# Patient Record
Sex: Female | Born: 1970 | Race: Black or African American | Hispanic: No | Marital: Married | Smoking: Current every day smoker
Health system: Southern US, Community
[De-identification: ages and names within clinical notes are randomized; demographics above are authoritative.]

## PROBLEM LIST (undated history)

## (undated) DIAGNOSIS — D649 Anemia, unspecified: Secondary | ICD-10-CM

## (undated) DIAGNOSIS — F17200 Nicotine dependence, unspecified, uncomplicated: Secondary | ICD-10-CM

## (undated) DIAGNOSIS — D219 Benign neoplasm of connective and other soft tissue, unspecified: Secondary | ICD-10-CM

## (undated) DIAGNOSIS — F1010 Alcohol abuse, uncomplicated: Secondary | ICD-10-CM

## (undated) HISTORY — DX: Nicotine dependence, unspecified, uncomplicated: F17.200

## (undated) HISTORY — DX: Alcohol abuse, uncomplicated: F10.10

## (undated) HISTORY — DX: Benign neoplasm of connective and other soft tissue, unspecified: D21.9

---

## 1991-05-13 HISTORY — PX: TUBAL LIGATION: SHX77

## 2002-05-12 HISTORY — PX: WISDOM TOOTH EXTRACTION: SHX21

## 2010-05-12 HISTORY — PX: CERVICAL DISC SURGERY: SHX588

## 2012-02-14 ENCOUNTER — Emergency Department (HOSPITAL_COMMUNITY)
Admission: EM | Admit: 2012-02-14 | Discharge: 2012-02-14 | Disposition: A | Discharge status: Home / Self Care | Payer: Self-pay | Attending: Emergency Medicine | Admitting: Emergency Medicine

## 2012-02-14 ENCOUNTER — Encounter (HOSPITAL_COMMUNITY): Payer: Self-pay | Admitting: Emergency Medicine

## 2012-02-14 ENCOUNTER — Emergency Department (HOSPITAL_COMMUNITY): Payer: Self-pay

## 2012-02-14 DIAGNOSIS — F17200 Nicotine dependence, unspecified, uncomplicated: Secondary | ICD-10-CM | POA: Insufficient documentation

## 2012-02-14 DIAGNOSIS — D259 Leiomyoma of uterus, unspecified: Secondary | ICD-10-CM | POA: Insufficient documentation

## 2012-02-14 LAB — CBC WITH DIFFERENTIAL/PLATELET
Basophils Absolute: 0.1 10*3/uL (ref 0.0–0.1)
HCT: 29.7 % — ABNORMAL LOW (ref 36.0–46.0)
Lymphocytes Relative: 26 % (ref 12–46)
Neutro Abs: 3.9 10*3/uL (ref 1.7–7.7)
Neutrophils Relative %: 62 % (ref 43–77)
Platelets: 288 10*3/uL (ref 150–400)
RDW: 19.1 % — ABNORMAL HIGH (ref 11.5–15.5)
WBC: 6.2 10*3/uL (ref 4.0–10.5)

## 2012-02-14 LAB — URINALYSIS, ROUTINE W REFLEX MICROSCOPIC
Glucose, UA: NEGATIVE mg/dL
Leukocytes, UA: NEGATIVE
Nitrite: NEGATIVE
Protein, ur: NEGATIVE mg/dL
Urobilinogen, UA: 0.2 mg/dL (ref 0.0–1.0)

## 2012-02-14 LAB — COMPREHENSIVE METABOLIC PANEL
AST: 90 U/L — ABNORMAL HIGH (ref 0–37)
Albumin: 4 g/dL (ref 3.5–5.2)
CO2: 26 mEq/L (ref 19–32)
Chloride: 98 mEq/L (ref 96–112)
GFR calc non Af Amer: >90 mL/min (ref >90)
Sodium: 132 mEq/L — ABNORMAL LOW (ref 135–145)

## 2012-02-14 MED ORDER — MORPHINE SULFATE 4 MG/ML IJ SOLN
6.0000 mg | Freq: Once | INTRAMUSCULAR | Status: AC
  Administered 2012-02-14: 6 mg via INTRAVENOUS
  Filled 2012-02-14: qty 2

## 2012-02-14 MED ORDER — SODIUM CHLORIDE 0.9 % IV SOLN
1000.0000 mL | Freq: Once | INTRAVENOUS | Status: AC
  Administered 2012-02-14: 1000 mL via INTRAVENOUS

## 2012-02-14 MED ORDER — PROMETHAZINE HCL 25 MG PO TABS: 25.0000 mg | ORAL_TABLET | Freq: Four times a day (QID) | ORAL | Status: DC | PRN

## 2012-02-14 MED ORDER — IOHEXOL 300 MG/ML  SOLN
100.0000 mL | Freq: Once | INTRAMUSCULAR | Status: AC | PRN
  Administered 2012-02-14: 100 mL via INTRAVENOUS

## 2012-02-14 MED ORDER — SODIUM CHLORIDE 0.9 % IV SOLN: 1000.0000 mL | INTRAVENOUS | Status: DC

## 2012-02-14 MED ORDER — HYDROCODONE-ACETAMINOPHEN 5-325 MG PO TABS: 1.0000 | ORAL_TABLET | ORAL | Status: DC | PRN

## 2012-02-14 NOTE — ED Notes (Signed)
Abdominal pain onset one week ago. Denies emesis, denies chills or fever. States periods have been normal until most recent.

## 2012-02-14 NOTE — ED Provider Notes (Signed)
History     CSN: [id number]  Arrival date & time 02/14/12  1114   First MD Initiated Contact with Patient 02/14/12 1148      Chief Complaint  Patient presents with  . Abdominal Pain     The history is provided by the patient.   patient reports a long-standing history of worsening abdominal swelling.  She has a history of uterine fibroids.  She reports her abdominal pain became more severe this week.  She denies nausea and vomiting.  She's had no diarrhea.  She continues to have irregular and heavy menstrual cycles.  She recently moved to [place] from [place].   History reviewed. No pertinent past medical history.  Past Surgical History  Procedure Date  . Back surgery   . Tubal ligation 1993    No family history on file.  History  Substance Use Topics  . Smoking status: Current Every Day Smoker -- 0.5 packs/day    Types: Cigarettes  . Smokeless tobacco: Never Used  . Alcohol Use: 12.6 oz/week    21 Cans of beer per week     daily    OB History    Grav Para Term Preterm Abortions TAB SAB Ect Mult Living                  Review of Systems  All other systems reviewed and are negative.    Allergies  Review of patient's allergies indicates no known allergies.  Home Medications   Current Outpatient Rx  Name Route Sig Dispense Refill  . FERROUS SULFATE 325 (65 FE) MG PO TABS Oral Take 325 mg by mouth 2 (two) times daily with breakfast and lunch.    . ADULT MULTIVITAMIN W/MINERALS CH Oral Take 1 tablet by mouth daily.    Marland Kitchen HYDROCODONE-ACETAMINOPHEN 5-325 MG PO TABS Oral Take 1 tablet by mouth every 4 (four) hours as needed for pain. 15 tablet 0  . PROMETHAZINE HCL 25 MG PO TABS Oral Take 1 tablet (25 mg total) by mouth every 6 (six) hours as needed for nausea. 12 tablet 0    BP 113/58  Pulse 65  Temp 97.9 F (36.6 C) (Oral)  Resp 16  Ht 5\' 3"  (1.6 m)  Wt 120 lb (54.432 kg)  BMI 21.26 kg/m2  SpO2 100%  LMP 02/08/2012  Physical Exam  Nursing note and  vitals reviewed. Constitutional: She is oriented to person, place, and time. She appears well-developed and well-nourished. No distress.  HENT:  Head: Normocephalic and atraumatic.  Eyes: EOM are normal.  Neck: Normal range of motion.  Cardiovascular: Normal rate, regular rhythm and normal heart sounds.   Pulmonary/Chest: Effort normal and breath sounds normal.  Abdominal: Soft. She exhibits no distension.       Large palpable abdominal mass likely representing uterine fibroid.  Nontender.  Bowel sounds are normal  Musculoskeletal: Normal range of motion.  Neurological: She is alert and oriented to person, place, and time.  Skin: Skin is warm and dry.  Psychiatric: She has a normal mood and affect. Judgment normal.    ED Course  Procedures (including critical care time)  Labs Reviewed  URINALYSIS, ROUTINE W REFLEX MICROSCOPIC - Abnormal; Notable for the following:    APPearance CLOUDY (*)     All other components within normal limits  CBC WITH DIFFERENTIAL - Abnormal; Notable for the following:    RBC 3.68 (*)     Hemoglobin 9.4 (*)     HCT 29.7 (*)  MCH 25.5 (*)     RDW 19.1 (*)     All other components within normal limits  COMPREHENSIVE METABOLIC PANEL - Abnormal; Notable for the following:    Sodium 132 (*)  HEMOLYZED SPECIMEN - SUGGEST RECOLLECT   Glucose, Bld 102 (*)  HEMOLYZED SPECIMEN - SUGGEST RECOLLECT   Total Protein 8.4 (*)  HEMOLYZED SPECIMEN - SUGGEST RECOLLECT   AST 90 (*)  HEMOLYZED SPECIMEN - SUGGEST RECOLLECT   All other components within normal limits  POCT PREGNANCY, URINE  LAB REPORT - SCANNED   Ct Abdomen Pelvis W Contrast  02/14/2012  *RADIOLOGY REPORT*  Clinical Data: Abdominal pain.  Uterine fibroid.  CT ABDOMEN AND PELVIS WITH CONTRAST  Technique:  Multidetector CT imaging of the abdomen and pelvis was performed following the standard protocol during bolus administration of intravenous contrast.  Contrast: OMNIPAQUE IOHEXOL 300 MG/ML  SOLN   Comparison: None.  Findings: The liver, spleen, pancreas, and adrenal glands appear unremarkable.  The gallbladder and biliary system appear unremarkable.  Borderline hydronephrosis and mild hydroureter on the right extend down to the iliac vessel crossover.  Numerous large intramural uterine fibroids are present, with heterogeneous internal enhancement and some associated calcification.  Uterine length is 22.3 cm and uterine volume is estimated at 2000 ml, with the uterus extending up above the level of the umbilicus.  A low-density 4.1 x 1.9 cm structure displaced into the abdomen on image 32 of series 2 probably represents the left ovary.  Right ovary is down in the pelvis along the right anterior inferior margin of the uterus.  Urinary bladder unremarkable.  Appendix normal.  IMPRESSION:  1.  Multiple large uterine fibroids causing markedly enlarged uterus extending up above the level of the umbilicus.  Associated volume is over 2000 ml. 2.  Extrinsic mass effect from the uterus is likely causing low- level partial obstruction of the right ureter at the level of the iliac vessel crossover.   Original Report Authenticated By: Dellia Cloud, M.D.     I personally reviewed the imaging tests through PACS system  I reviewed available ER/hospitalization records thought the EMR   1. Uterine fibroid       MDM  This appears to be a large uterine fibroid.  She has normal vital signs.  I've instructed the patient to followup at that gynecology clinic at [facility] hospital for unassigned gynecology care.  She understands the importance of close followup.  There is question of possible early mass effect on the right ureter.  At this time there is no significant hydronephrosis in her BUN and creatinine are normal.  Ever for the patient to urology as she may benefit from stent placement.  Ultimately what she really needs is hysterectomy for removal of the fibroid which will improve her abdominal mass,  abdominal pain in her ureteral compression        Lyanne Co, MD 02/15/12 1534

## 2012-03-15 ENCOUNTER — Ambulatory Visit (INDEPENDENT_AMBULATORY_CARE_PROVIDER_SITE_OTHER): Payer: Self-pay | Admitting: Obstetrics & Gynecology

## 2012-03-15 ENCOUNTER — Encounter: Payer: Self-pay | Admitting: Obstetrics & Gynecology

## 2012-03-15 VITALS — BP 113/69 | HR 80 | Temp 97.0°F | Ht 63.0 in | Wt 126.8 lb

## 2012-03-15 DIAGNOSIS — D259 Leiomyoma of uterus, unspecified: Secondary | ICD-10-CM | POA: Insufficient documentation

## 2012-03-15 MED ORDER — LEUPROLIDE ACETATE (3 MONTH) 11.25 MG IM KIT: 11.2500 mg | PACK | Freq: Once | INTRAMUSCULAR | Status: DC

## 2012-03-15 MED ORDER — MEDROXYPROGESTERONE ACETATE 5 MG PO TABS: 5.0000 mg | ORAL_TABLET | Freq: Every day | ORAL | Status: DC

## 2012-03-15 NOTE — Patient Instructions (Signed)
Uterine Fibroid  A uterine fibroid is a growth (tumor) that occurs in a woman's uterus. This type of tumor is not cancerous and does not spread out of the uterus. A woman can have one or many fibroids, and the fiboid(s) can become quite large. A fibroid can vary in size, weight, and where it grows in the uterus. Most fibroids do not require medical treatment, but some can cause pain or heavy bleeding during and between periods.  CAUSES   A fibroid is the result of a single uterine cell that keeps growing (unregulated), which is different than most cells in the human body. Most cells have a control mechanism that keeps them from reproducing without control.   SYMPTOMS    Bleeding.   Pelvic pain and pressure.   Bladder problems due to the size of the fibroid.   Infertility and miscarriages depending on the size and location of the fibroid.  DIAGNOSIS   A diagnosis is made by physical exam. Your caregiver may feel the lumpy tumors during a pelvic exam. Important information regarding size, location, and number of tumors can be gained by having an ultrasound. It is rare that other tests, such as a CT scan or MRI, are needed.  TREATMENT    Your caregiver may recommend watchful waiting. This involves getting the fibroid checked by your caregiver to see if the fibroids grow or shrink.    Hormonal treatment or an intrauterine device (IUD) may be prescribed.    Surgery may be needed to remove the fibroids (myomectomy) or the uterus (hysterectomy). This depends on your situation.  When fibroids interfere with fertility and a woman wants to become pregnant, a caregiver may recommend having the fibroids removed.   HOME CARE INSTRUCTIONS   Home care depends on how you were treated. In general:    Keep all follow-up appointments with your caregiver.    Only take medicine as told by your caregiver. Do not take aspirin. It can cause bleeding.    If you have excessive periods and soak tampons or pads in a half hour or  less, contact your caregiver immediately. If your periods are troublesome but not so heavy, lie down with your feet raised slightly above your heart. Place cold packs on your lower abdomen.    If your periods are heavy, write down the number of pads or tampons you use per month. Bring this information to your caregiver.    Talk to your caregiver about taking iron pills.    Include green vegetables in your diet.    If you were prescribed a hormonal treatment, take the hormonal medicines as directed.    If you need surgery, ask your caregiver for information on your specific surgery.   SEEK IMMEDIATE MEDICAL CARE IF:   You have pelvic pain or cramps not controlled with medicines.    You have a sudden increase in pelvic pain.    You have an increase of bleeding between and during periods.    You feel lightheaded or have fainting episodes.   MAKE SURE YOU:   Understand these instructions.   Will watch your condition.   Will get help right away if you are not doing well or get worse.  Document Released: 04/25/2000 Document Revised: 07/21/2011 Document Reviewed: 05/19/2011  Exit[employer] Patient Information 2013 [employer].

## 2012-03-15 NOTE — Progress Notes (Signed)
Patient ID: Miranda Jackson, female   DOB: February 08, 1971, 41 y.o.   MRN: [id number]  Chief Complaint  Patient presents with  . Fibroids    "was told from ER that if something does not get done it could affect my kidneys".  Not bleeding today but when start period would last for 8-10 days change several periods    HPI Miranda Jackson is a 41 y.o. female.  Heavy menses, mass in lower abdomen.  ER visit 10/5 abnormal CT scan HPI  Past Medical History  Diagnosis Date  . Fibroids     Past Surgical History  Procedure Date  . Back surgery   . Tubal ligation 1993    Family History  Problem Relation Age of Onset  . Hypertension Maternal Grandfather     Social History History  Substance Use Topics  . Smoking status: Current Every Day Smoker -- 0.5 packs/day    Types: Cigarettes  . Smokeless tobacco: Never Used  . Alcohol Use: 12.6 oz/week    21 Cans of beer per week     Comment: daily    No Known Allergies  Current Outpatient Prescriptions  Medication Sig Dispense Refill  . ferrous sulfate 325 (65 FE) MG tablet Take 325 mg by mouth 2 (two) times daily with breakfast and lunch.      . Multiple Vitamin (MULTIVITAMIN WITH MINERALS) TABS Take 1 tablet by mouth daily.      Marland Kitchen HYDROcodone-acetaminophen (NORCO/VICODIN) 5-325 MG per tablet Take 1 tablet by mouth every 4 (four) hours as needed for pain.  15 tablet  0  . medroxyPROGESTERone (PROVERA) 5 MG tablet Take 1 tablet (5 mg total) by mouth daily.  30 tablet  2  . promethazine (PHENERGAN) 25 MG tablet Take 1 tablet (25 mg total) by mouth every 6 (six) hours as needed for nausea.  12 tablet  0   Current Facility-Administered Medications  Medication Dose Route Frequency Provider Last Rate Last Dose  . leuprolide (LUPRON) injection 11.25 mg  11.25 mg Intramuscular Once Adam Phenix, MD        Review of Systems Review of Systems  Constitutional: Negative.   Gastrointestinal:       Low abdominal mass  Genitourinary: Positive for  menstrual problem and pelvic pain.    Blood pressure 113/69, pulse 80, temperature 97 F (36.1 C), temperature source Oral, height 5\' 3"  (1.6 m), weight 126 lb 12.8 oz (57.516 kg), last menstrual period 03/10/2012.  Physical Exam Physical Exam  Constitutional: She appears well-developed. No distress.  Pulmonary/Chest: Effort normal. No respiratory distress.  Abdominal: Soft. She exhibits mass (18-20 week size pelvic mass).  Genitourinary: Vagina normal. No vaginal discharge found.       Pap done. Uterus irregular 18-20 week  Neurological: She is alert.  Skin: Skin is warm and dry. No pallor.  Psychiatric: She has a normal mood and affect. Her behavior is normal.    Data Reviewed CBC    Component Value Date/Time   WBC 6.2 02/14/2012 1220   RBC 3.68* 02/14/2012 1220   HGB 9.4* 02/14/2012 1220   HCT 29.7* 02/14/2012 1220   PLT 288 02/14/2012 1220   MCV 80.7 02/14/2012 1220   MCH 25.5* 02/14/2012 1220   MCHC 31.6 02/14/2012 1220   RDW 19.1* 02/14/2012 1220   LYMPHSABS 1.6 02/14/2012 1220   MONOABS 0.5 02/14/2012 1220   EOSABS 0.2 02/14/2012 1220   BASOSABS 0.1 02/14/2012 1220    *RADIOLOGY REPORT*  Clinical Data: Abdominal pain. Uterine fibroid.  CT ABDOMEN AND PELVIS WITH CONTRAST  Technique: Multidetector CT imaging of the abdomen and pelvis was  performed following the standard protocol during bolus  administration of intravenous contrast.  Contrast: OMNIPAQUE IOHEXOL 300 MG/ML SOLN  Comparison: None.  Findings: The liver, spleen, pancreas, and adrenal glands appear  unremarkable.  The gallbladder and biliary system appear unremarkable.  Borderline hydronephrosis and mild hydroureter on the right extend  down to the iliac vessel crossover. Numerous large intramural  uterine fibroids are present, with heterogeneous internal  enhancement and some associated calcification. Uterine length is  22.3 cm and uterine volume is estimated at 2000 ml, with the uterus  extending up  above the level of the umbilicus.  A low-density 4.1 x 1.9 cm structure displaced into the abdomen on  image 32 of series 2 probably represents the left ovary. Right  ovary is down in the pelvis along the right anterior inferior  margin of the uterus.  Urinary bladder unremarkable. Appendix normal.  IMPRESSION:  1. Multiple large uterine fibroids causing markedly enlarged  uterus extending up above the level of the umbilicus. Associated  volume is over 2000 ml.  2. Extrinsic mass effect from the uterus is likely causing low-  level partial obstruction of the right ureter at the level of the  iliac vessel crossover.  Original Report Authenticated By: Dellia Cloud, M.D.    Assessment    Fibroid uterus and menorrhagia    Plan    Provera 5 mg Lupron depot 11.25 Endometrial biopsy Candidate for TAH  Miranda Jackson 03/15/2012 5:10 PM        Miranda Jackson 03/15/2012, 5:06 PM

## 2012-03-16 ENCOUNTER — Telehealth: Payer: Self-pay | Admitting: Medical

## 2012-03-16 DIAGNOSIS — D259 Leiomyoma of uterus, unspecified: Secondary | ICD-10-CM

## 2012-03-16 NOTE — Telephone Encounter (Signed)
Attempted to contact patient. Patient left without Rx yesterday and has no pharmacy listed in Epic. If patient calls back please confirm pharmacy for Rx to be called in.

## 2012-03-23 MED ORDER — MEDROXYPROGESTERONE ACETATE 5 MG PO TABS: 5.0000 mg | ORAL_TABLET | Freq: Every day | ORAL | Status: DC

## 2012-03-23 NOTE — Telephone Encounter (Signed)
Called and spoke w/pt. I informed her of Rx needed and obtained the name/location of her pharmacy. Rx sent to [facility] and pt will pick up later today.  Pt voiced understanding.

## 2013-02-14 ENCOUNTER — Ambulatory Visit: Payer: No Typology Code available for payment source | Attending: Internal Medicine

## 2013-03-10 ENCOUNTER — Encounter: Payer: Self-pay | Admitting: Internal Medicine

## 2013-03-10 ENCOUNTER — Ambulatory Visit: Payer: No Typology Code available for payment source | Attending: Internal Medicine | Admitting: Internal Medicine

## 2013-03-10 VITALS — BP 105/70 | HR 90 | Temp 98.4°F | Resp 16 | Ht 63.0 in | Wt 130.0 lb

## 2013-03-10 DIAGNOSIS — D649 Anemia, unspecified: Secondary | ICD-10-CM

## 2013-03-10 DIAGNOSIS — D259 Leiomyoma of uterus, unspecified: Secondary | ICD-10-CM | POA: Insufficient documentation

## 2013-03-10 LAB — ANEMIA PANEL
%SAT: 4 % — ABNORMAL LOW (ref 20–55)
ABS Retic: 41.6 10*3/uL (ref 19.0–186.0)
Ferritin: 4 ng/mL — ABNORMAL LOW (ref 10–291)
Iron: 17 ug/dL — ABNORMAL LOW (ref 42–145)
Retic Ct Pct: 1.2 % (ref 0.4–2.3)
Vitamin B-12: 620 pg/mL (ref 211–911)

## 2013-03-10 LAB — LIPID PANEL
LDL Cholesterol: 101 mg/dL — ABNORMAL HIGH (ref 0–99)
Total CHOL/HDL Ratio: 2.8 Ratio

## 2013-03-10 LAB — TSH: TSH: 0.863 u[IU]/mL (ref 0.350–4.500)

## 2013-03-10 LAB — CBC
HCT: 24.6 % — ABNORMAL LOW (ref 36.0–46.0)
MCH: 21.6 pg — ABNORMAL LOW (ref 26.0–34.0)
MCV: 70.9 fL — ABNORMAL LOW (ref 78.0–100.0)
Platelets: 244 10*3/uL (ref 150–400)
RDW: 19.6 % — ABNORMAL HIGH (ref 11.5–15.5)

## 2013-03-10 MED ORDER — FERROUS SULFATE 325 (65 FE) MG PO TABS: 325.0000 mg | ORAL_TABLET | Freq: Three times a day (TID) | ORAL | Status: DC

## 2013-03-10 NOTE — Progress Notes (Signed)
Pt here to establish care for large uterine fibroids Denies vag bleeding at this time Need physical with pap smear No other medical hx noted

## 2013-03-10 NOTE — Progress Notes (Signed)
Pt instructed to return in 2 weeks per Dr. Jerral Ralph

## 2013-03-10 NOTE — Progress Notes (Signed)
Patient Demographics  Miranda Jackson, is a 42 y.o. female  UJW:[id number]  FAO:[id number]  DOB - 02-19-1971  Chief Complaint  Patient presents with  . Establish Care  . Gynecologic Exam        Subjective:   Miranda Jackson today is here to establish primary care.  Patient is a 42 year old female with a known history of acute uterine fibroids (above the level of the umblicus in 2013 with mass effect on the ureter) who had refused hysterectomy last year, presents to establish medical care. She claims she has significant menorrhagia, sometimes she has periods twice a month and passes clots. She does claim to have some fatigue and exertional dyspnea. She's not on any hormonal therapy, does not take any iron supplementation. She's now anxious to get a hysterectomy and once his referral to the women's clinic.  Currently patient has no complaints. Patient has also has No headache, No chest pain, No abdominal pain,No Nausea, No new weakness tingling or numbness, No Cough or SOB.   Objective:    Filed Vitals:   03/10/13 1515  BP: 105/70  Pulse: 90  Temp: 98.4 F (36.9 C)  TempSrc: Oral  Resp: 16  Height: 5\' 3"  (1.6 m)  Weight: 130 lb (58.968 kg)  SpO2: 100%     ALLERGIES:  No Known Allergies  PAST MEDICAL HISTORY: Past Medical History  Diagnosis Date  . Fibroids     PAST SURGICAL HISTORY: Past Surgical History  Procedure Laterality Date  . Back surgery    . Tubal ligation  1993    FAMILY HISTORY: Family History  Problem Relation Age of Onset  . Hypertension Maternal Grandfather     MEDICATIONS AT HOME: Prior to Admission medications   Medication Sig Start Date End Date Taking? Authorizing Provider  ferrous sulfate 325 (65 FE) MG tablet Take 1 tablet (325 mg total) by mouth 3 (three) times daily with meals. 03/10/13   Shanker Levora Dredge, MD  HYDROcodone-acetaminophen (NORCO/VICODIN) 5-325 MG per tablet Take 1 tablet by mouth every 4 (four) hours as needed for  pain. 02/14/12   Lyanne Co, MD  medroxyPROGESTERone (PROVERA) 5 MG tablet Take 1 tablet (5 mg total) by mouth daily. 03/23/12   Adam Phenix, MD  Multiple Vitamin (MULTIVITAMIN WITH MINERALS) TABS Take 1 tablet by mouth daily.    Historical Provider, MD  promethazine (PHENERGAN) 25 MG tablet Take 1 tablet (25 mg total) by mouth every 6 (six) hours as needed for nausea. 02/14/12   Lyanne Co, MD    SOCIAL HISTORY:   reports that she has been smoking Cigarettes.  She has been smoking about 0.50 packs per day. She has never used smokeless tobacco. She reports that she drinks about 12.6 ounces of alcohol per week. She reports that she does not use illicit drugs.  REVIEW OF SYSTEMS:  Constitutional:   No   Fevers, chills, fatigue.  HEENT:    No headaches, Sore throat,   Cardio-vascular: No chest pain,  Orthopnea, swelling in lower extremities, anasarca, palpitations  GI:  No abdominal pain, nausea, vomiting, diarrhea  Resp: No shortness of breath,  No coughing up of blood.No cough.No wheezing.  Skin:  no rash or lesions.  GU:  no dysuria, change in color of urine, no urgency or frequency.  No flank pain.  Musculoskeletal: No joint pain or swelling.  No decreased range of motion.  No back pain.  Psych: No change in mood or affect. No depression or anxiety.  No  memory loss.   Exam  General appearance :Awake, alert, not in any distress. Speech Clear. Not toxic Looking HEENT: Atraumatic and Normocephalic, pupils equally reactive to light and accomodation Neck: supple, no JVD. No cervical lymphadenopathy.  Chest:Good air entry bilaterally, no added sounds  CVS: S1 S2 regular, no murmurs.  Abdomen: Bowel sounds present, Non tender and not distended with no gaurding, rigidity or rebound., Oval-shaped mass that extends from the pelvis approximately 5 cm above the umblicus. Extremities: B/L Lower Ext shows no edema, both legs are warm to touch Neurology: Awake alert, and  oriented X 3, CN II-XII intact, Non focal Skin:No Rash Wounds:N/A    Data Review   CBC No results found for this basename: WBC, HGB, HCT, PLT, MCV, MCH, MCHC, RDW, NEUTRABS, LYMPHSABS, MONOABS, EOSABS, BASOSABS, BANDABS, BANDSABD,  in the last 168 hours  Chemistries   No results found for this basename: NA, K, CL, CO2, GLUCOSE, BUN, CREATININE, GFRCGP, CALCIUM, MG, AST, ALT, ALKPHOS, BILITOT,  in the last 168 hours ------------------------------------------------------------------------------------------------------------------ No results found for this basename: HGBA1C,  in the last 72 hours ------------------------------------------------------------------------------------------------------------------ No results found for this basename: CHOL, HDL, LDLCALC, TRIG, CHOLHDL, LDLDIRECT,  in the last 72 hours ------------------------------------------------------------------------------------------------------------------ No results found for this basename: TSH, T4TOTAL, FREET3, T3FREE, THYROIDAB,  in the last 72 hours ------------------------------------------------------------------------------------------------------------------ No results found for this basename: VITAMINB12, FOLATE, FERRITIN, TIBC, IRON, RETICCTPCT,  in the last 72 hours  Coagulation profile  No results found for this basename: INR, PROTIME,  in the last 168 hours    Assessment & Plan   Fibroid uterus - Will place a urgent referral to the women's clinic - Pelvic ultrasound  Anemia - Suspect chronic blood loss from menorrhagia - Start iron supplementation  Will order routine labs-CBC, chemistries, A1C, TSH and lipid panel- if she has any significant worsening of her renal function-she may have hydronephrosis from mass effect and may need further imaging.  Follow up in 2 weeks for followup above labs and ultrasound.  The patient was given clear instructions to go to ER or return to medical center if  symptoms don't improve, worsen or new problems develop. The patient verbalized understanding. The patient was told to call to get lab results if they haven't heard anything in the next week.

## 2013-03-11 ENCOUNTER — Encounter (HOSPITAL_COMMUNITY): Payer: Self-pay | Admitting: *Deleted

## 2013-03-11 ENCOUNTER — Inpatient Hospital Stay (HOSPITAL_COMMUNITY)
Admission: AD | Admit: 2013-03-11 | Discharge: 2013-03-11 | Disposition: A | Discharge status: Home / Self Care | Payer: Self-pay | Source: Ambulatory Visit | Attending: Obstetrics & Gynecology | Admitting: Obstetrics & Gynecology

## 2013-03-11 DIAGNOSIS — D500 Iron deficiency anemia secondary to blood loss (chronic): Secondary | ICD-10-CM | POA: Insufficient documentation

## 2013-03-11 DIAGNOSIS — D259 Leiomyoma of uterus, unspecified: Secondary | ICD-10-CM | POA: Insufficient documentation

## 2013-03-11 DIAGNOSIS — N920 Excessive and frequent menstruation with regular cycle: Secondary | ICD-10-CM | POA: Insufficient documentation

## 2013-03-11 HISTORY — DX: Anemia, unspecified: D64.9

## 2013-03-11 LAB — HEMOGLOBIN A1C
Hgb A1c MFr Bld: 4.9 % (ref <5.7)
Mean Plasma Glucose: 94 mg/dL (ref <117)

## 2013-03-11 LAB — CBC
Hemoglobin: 6.4 g/dL — CL (ref 12.0–15.0)
MCH: 21.4 pg — ABNORMAL LOW (ref 26.0–34.0)
MCHC: 29.8 g/dL — ABNORMAL LOW (ref 30.0–36.0)
Platelets: 179 10*3/uL (ref 150–400)
RDW: 20.1 % — ABNORMAL HIGH (ref 11.5–15.5)

## 2013-03-11 MED ORDER — SODIUM CHLORIDE 0.9 % IV SOLN
1020.0000 mg | Freq: Once | INTRAVENOUS | Status: AC
  Administered 2013-03-11: 1020 mg via INTRAVENOUS
  Filled 2013-03-11: qty 34

## 2013-03-11 MED ORDER — SODIUM CHLORIDE 0.9 % IV SOLN
INTRAVENOUS | Status: DC
  Administered 2013-03-11: 18:00:00 via INTRAVENOUS

## 2013-03-11 MED ORDER — PRENATAL PLUS 27-1 MG PO TABS: 1.0000 | ORAL_TABLET | Freq: Every day | ORAL | Status: DC

## 2013-03-11 MED ORDER — PRENATAL PLUS 27-1 MG PO TABS: 1.0000 | ORAL_TABLET | Freq: Two times a day (BID) | ORAL | Status: DC

## 2013-03-11 MED ORDER — MEGESTROL ACETATE 40 MG PO TABS: 40.0000 mg | ORAL_TABLET | Freq: Every day | ORAL | Status: DC

## 2013-03-11 NOTE — MAU Provider Note (Signed)
CC: Abdominal Pain    First Provider Initiated Contact with Patient 03/11/13 1656      HPI Miranda Jackson is a 42 y.o. [id number] who presents after being seen yesterday by PCP, Dr. Jerral Ralph, and was called and told to come here because her blood count was low (hgb 7.5). She has multiple large fibroids and menometrorrhagia. She reports having heavy menses with passage of clots often 2 times a month. LMP 03/04/13. She states she was bleeding heavily through yesterday, but it has tapered today. She has chronic abdominal pain. No orthostatic symptoms or increased fatigue. She does have a headache and states she has been drinking beer all day at a Halloween party. She has an appointment Monday for pelvic US.  She was evaluated about a year ago by Dr. Debroah Loop at [facility]. At that time she had MRI showing multiple large fibroids with mild obstruction of right ureter. She was given Provera which she states did not help with her bleeding. Plan was to have an endometrial biopsy and Lupron but she did neither of those. She was offered a hysterectomy but declined at that time; now she does want TAH. Denies illicit drug use or other medical problems.    Past Medical History  Diagnosis Date  . Fibroids   . Anemia     OB History  Gravida Para Term Preterm AB SAB TAB Ectopic Multiple Living  3 3 3       3     # Outcome Date GA Lbr Len/2nd Weight Sex Delivery Anes PTL Lv  3 TRM      SVD   Y  2 TRM      SVD   Y  1 TRM      SVD   Y      Past Surgical History  Procedure Laterality Date  . Back surgery    . Tubal ligation  1993    History   Social History  . Marital Status: Married    Spouse Name: N/A    Number of Children: N/A  . Years of Education: N/A   Occupational History  . Not on file.   Social History Main Topics  . Smoking status: Current Every Day Smoker -- 0.50 packs/day    Types: Cigarettes  . Smokeless tobacco: Never Used  . Alcohol Use: 12.6 oz/week    21 Cans of beer per  week     Comment: daily  . Drug Use: No  . Sexual Activity: Yes    Birth Control/ Protection: Surgical   Other Topics Concern  . Not on file   Social History Narrative  . No narrative on file    No current facility-administered medications on file prior to encounter.   Current Outpatient Prescriptions on File Prior to Encounter  Medication Sig Dispense Refill  . Multiple Vitamin (MULTIVITAMIN WITH MINERALS) TABS Take 1 tablet by mouth daily.        No Known Allergies  ROS Pertinent items in HPI  PHYSICAL EXAM Filed Vitals:   03/11/13 1626  BP: 110/57  Pulse: 80  Temp: 98.3 F (36.8 C)  Resp: 18   General: Well nourished, well developed female in no acute distress, apparently mildly inebriated Cardiovascular: RRR w/o murmur Respiratory: Normal effort Abdomen: Soft, mildly tender, uterus enlarged 26 wk size, firm, irregular. nontender Back: No CVAT Extremities: No edema Neurologic: Alert and oriented Peri-pad: small amount dark blood  LAB RESULTS Results for orders placed during the hospital encounter of 03/11/13 (from the  past 24 hour(s))  CBC     Status: Abnormal   Collection Time    03/11/13  5:20 PM      Result Value Range   WBC 5.7  4.0 - 10.5 K/uL   RBC 2.99 (*) 3.87 - 5.11 MIL/uL   Hemoglobin 6.4 (*) 12.0 - 15.0 g/dL   HCT 45.4 (*) 09.8 - 11.9 %   MCV 71.9 (*) 78.0 - 100.0 fL   MCH 21.4 (*) 26.0 - 34.0 pg   MCHC 29.8 (*) 30.0 - 36.0 g/dL   RDW 14.7 (*) 82.9 - 56.2 %   Platelets 179  150 - 400 K/uL    IMAGING No results found.  MAU COURSE C/W Dr. Erin Fulling. IV Ferraheme and D/C home on Megace and Integra   ASSESSMENT  1. 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 See AVS for patient education.    Medication List    STOP taking these medications       multivitamin with minerals Tabs tablet      TAKE these medications       megestrol 40 MG tablet  Commonly known as:   MEGACE  Take 1 tablet (40 mg total) by mouth daily.     OVER THE COUNTER MEDICATION  Take 1 tablet by mouth daily. Patient takes B 12     prenatal vitamin w/FE, FA 27-1 MG Tabs tablet  Take 1 tablet by mouth 2 (two) times daily.       Follow-up Information   Follow up with [facility]. (someone from clinic will call you with an appointment within the next 2 weeks)    Specialty:  Obstetrics and Gynecology   Contact information:   [address] [place] [address] [phone]       Danae Orleans, CNM 03/11/2013 5:27 PM

## 2013-03-11 NOTE — Progress Notes (Signed)
CRITICAL VALUE ALERT  Critical value received:  Hgb 6.4 Hct 21.5  Date of notification:  03/11/13  Time of notification:  1733  Critical value read back:yes  Nurse who received alert:  Dorrene German RN  MD notified (1st page): D. Poe CNM  Time of first page: Verbally notified @ 1735  MD notified (2nd page) N/A  Time of second page:N/A  Responding MD: D. Poe CNM on unit  Time MD responded:  1735

## 2013-03-11 NOTE — MAU Note (Signed)
Came on menstrual twice in October.  Has a fibroid, wants it removed. Was at a halloween party, was received a call to go to the e.r.- her levels were too low.

## 2013-03-12 NOTE — MAU Provider Note (Signed)
Pt with anemia due to chronic blood loss most probably due to large uterine fibroids.  Pt was prev evaluated but, declined surgery. She is asymptomatic today.  Given IV iron and need f/u at GYN ofc.  Already has appt scheduled for sono.  Attestation of Attending Supervision of Advanced Practitioner (CNM/NP): Evaluation and management procedures were performed by the Advanced Practitioner under my supervision and collaboration.  I have reviewed the Advanced Practitioner's note and chart, and I agree with the management and plan.  HARRAWAY-SMITH, Donalyn Schneeberger 6:44 AM

## 2013-03-14 ENCOUNTER — Ambulatory Visit (HOSPITAL_COMMUNITY)
Admission: RE | Admit: 2013-03-14 | Discharge: 2013-03-14 | Disposition: A | Discharge status: Home / Self Care | Payer: No Typology Code available for payment source | Source: Ambulatory Visit | Attending: Internal Medicine | Admitting: Internal Medicine

## 2013-03-14 DIAGNOSIS — N852 Hypertrophy of uterus: Secondary | ICD-10-CM | POA: Insufficient documentation

## 2013-03-14 DIAGNOSIS — D259 Leiomyoma of uterus, unspecified: Secondary | ICD-10-CM

## 2013-03-14 DIAGNOSIS — D649 Anemia, unspecified: Secondary | ICD-10-CM

## 2013-03-14 DIAGNOSIS — R9389 Abnormal findings on diagnostic imaging of other specified body structures: Secondary | ICD-10-CM | POA: Insufficient documentation

## 2013-03-16 ENCOUNTER — Telehealth: Payer: Self-pay | Admitting: Emergency Medicine

## 2013-03-16 NOTE — Telephone Encounter (Signed)
Pt called with c/o clotting without pain.states she feels good but concerned about clotting. Pt unable to afford Megestrol 40 mg and Prenatal vitamin. Informed pt to go directly to [facility] if pain is followed with clots and dizziness

## 2013-03-18 ENCOUNTER — Telehealth: Payer: Self-pay

## 2013-03-18 NOTE — Telephone Encounter (Signed)
Spoke with kim at [employer] She stated that the cmp lab test was never ordered No results available for this test

## 2013-03-23 ENCOUNTER — Ambulatory Visit: Payer: No Typology Code available for payment source | Attending: Internal Medicine | Admitting: Internal Medicine

## 2013-03-23 VITALS — BP 119/75 | HR 77 | Temp 98.5°F | Resp 17 | Wt 128.0 lb

## 2013-03-23 DIAGNOSIS — D649 Anemia, unspecified: Secondary | ICD-10-CM

## 2013-03-23 DIAGNOSIS — D259 Leiomyoma of uterus, unspecified: Secondary | ICD-10-CM | POA: Insufficient documentation

## 2013-03-23 LAB — BASIC METABOLIC PANEL
Calcium: 9.1 mg/dL (ref 8.4–10.5)
Chloride: 105 mEq/L (ref 96–112)
Glucose, Bld: 78 mg/dL (ref 70–99)
Potassium: 3.9 mEq/L (ref 3.5–5.3)
Sodium: 137 mEq/L (ref 135–145)

## 2013-03-23 LAB — CBC
HCT: 26.8 % — ABNORMAL LOW (ref 36.0–46.0)
Hemoglobin: 8.1 g/dL — ABNORMAL LOW (ref 12.0–15.0)
Platelets: 195 10*3/uL (ref 150–400)
RBC: 3.26 MIL/uL — ABNORMAL LOW (ref 3.87–5.11)
RDW: 28.9 % — ABNORMAL HIGH (ref 11.5–15.5)
WBC: 5.1 10*3/uL (ref 4.0–10.5)

## 2013-03-23 NOTE — Progress Notes (Signed)
Patient ID: Miranda Jackson, female   DOB: 01-28-1971, 42 y.o.   MRN: [id number]  Patient Demographics  Miranda Jackson, is a 42 y.o. female  [id number]  [id number]  DOB - April 26, 1971  Chief Complaint  Patient presents with  . Follow-up  . Fibroids        Subjective:   Miranda Jackson with History of large uterine fibroid, severe menorrhagia  is here for followup, she is a pending visit with OB physician in 2 days for her uterine fibroid, she currently is having no vaginal spotting or bleeding. Her symptoms of fatigue and tiredness are much better. No other subjective complaints. She is agreeable to hysterectomy if needed.  Denies any subjective complaints except as above, no active headache, no chest abdominal pain at this time, not short of breath. No focal weakness which is new.   Objective:    Patient Active Problem List   Diagnosis Date Noted  . Anemia 03/23/2013  . Leiomyoma of uterus, unspecified 03/15/2012     Filed Vitals:   03/23/13 1422  BP: 119/75  Pulse: 77  Temp: 98.5 F (36.9 C)  Resp: 17  Weight: 128 lb (58.06 kg)  SpO2: 100%     Exam   Awake Alert, Oriented X 3, No new F.N deficits, Normal affect [place].AT,PERRAL Supple Neck,No JVD, No cervical lymphadenopathy appriciated.  Symmetrical Chest wall movement, Good air movement bilaterally, CTAB RRR,No Gallops,Rubs or new Murmurs, No Parasternal Heave +ve B.Sounds, Abd Soft, Non tender, large firm uterine mass palpable around the umbilicus, No rebound - guarding or rigidity. No Cyanosis, Clubbing or edema, No new Rash or bruise    Data Review   Lab Results  Component Value Date   WBC 5.7 03/11/2013   HGB 6.4* 03/11/2013   HCT 21.5* 03/11/2013   MCV 71.9* 03/11/2013   PLT 179 03/11/2013      Chemistry      Component Value Date/Time   NA 132* 02/14/2012 1220   K HEMOLYZED SPECIMEN - SUGGEST RECOLLECT 02/14/2012 1220   CL 98 02/14/2012 1220   CO2 26 02/14/2012 1220   BUN 8 02/14/2012 1220   CREATININE 0.57 02/14/2012 1220      Component Value Date/Time   CALCIUM 9.2 02/14/2012 1220   ALKPHOS HEMOLYZED SPECIMEN - SUGGEST RECOLLECT 02/14/2012 1220   AST 90* 02/14/2012 1220   ALT HEMOLYZED SPECIMEN - SUGGEST RECOLLECT 02/14/2012 1220   BILITOT HEMOLYZED SPECIMEN - SUGGEST RECOLLECT 02/14/2012 1220       Lab Results  Component Value Date   HGBA1C 4.9 03/10/2013    Lab Results  Component Value Date   CHOL 185 03/10/2013   HDL 67 03/10/2013   LDLCALC 101* 03/10/2013   TRIG 84 03/10/2013   CHOLHDL 2.8 03/10/2013    Lab Results  Component Value Date   TSH 0.863 03/10/2013    No results found for this basename: PSA      Prior to Admission medications   Medication Sig Start Date End Date Taking? Authorizing Provider  megestrol (MEGACE) 40 MG tablet Take 1 tablet (40 mg total) by mouth daily. 03/11/13   Deirdre C Poe, CNM  OVER THE COUNTER MEDICATION Take 1 tablet by mouth daily. Patient takes B 12    Historical Provider, MD  prenatal vitamin w/FE, FA (PRENATAL 1 + 1) 27-1 MG TABS tablet Take 1 tablet by mouth 2 (two) times daily. 03/11/13   Deirdre Colin Mulders, CNM     Assessment & Plan    Massive uterine fibroids,  CT reviewed from last year which was showing some kinking of the right ureter, she was offered hysterectomy last year which she had refused. She is now agreeable for hysterectomy. She is a pending appointment with OB physician in 2 days. We'll request OB to consider hysterectomy on urgent basis. I will repeat BMP to look at her renal function, I hope she has not developed any renal insufficiency.   Anemia likely from severe menorrhagia that she experiences from a massive uterine fibroids, she started to take and supplements few weeks ago, will repeat CBC, if she still significantly anemic she might require transfusion and hospital visit for a short period.     Routine health maintenance.  She refused flu shot   Leroy Sea M.D on 03/23/2013 at 2:28  PM

## 2013-03-23 NOTE — Progress Notes (Signed)
Patient here for follow up on her fibroid

## 2013-03-24 LAB — ANEMIA PANEL
ABS Retic: 231.5 10*3/uL — ABNORMAL HIGH (ref 19.0–186.0)
Ferritin: 268 ng/mL (ref 10–291)
Iron: 99 ug/dL (ref 42–145)
RBC.: 3.26 MIL/uL — ABNORMAL LOW (ref 3.87–5.11)
TIBC: 348 ug/dL (ref 250–470)
UIBC: 249 ug/dL (ref 125–400)
Vitamin B-12: 505 pg/mL (ref 211–911)

## 2013-03-25 ENCOUNTER — Encounter: Payer: No Typology Code available for payment source | Admitting: Obstetrics & Gynecology

## 2013-04-11 ENCOUNTER — Encounter: Payer: No Typology Code available for payment source | Admitting: Obstetrics & Gynecology

## 2013-05-09 ENCOUNTER — Ambulatory Visit (INDEPENDENT_AMBULATORY_CARE_PROVIDER_SITE_OTHER): Payer: No Typology Code available for payment source | Admitting: Obstetrics & Gynecology

## 2013-05-09 ENCOUNTER — Encounter: Payer: Self-pay | Admitting: Obstetrics & Gynecology

## 2013-05-09 VITALS — BP 101/62 | HR 62 | Temp 97.6°F | Ht 63.0 in | Wt 130.9 lb

## 2013-05-09 DIAGNOSIS — D259 Leiomyoma of uterus, unspecified: Secondary | ICD-10-CM

## 2013-05-09 DIAGNOSIS — D219 Benign neoplasm of connective and other soft tissue, unspecified: Secondary | ICD-10-CM

## 2013-05-09 DIAGNOSIS — D500 Iron deficiency anemia secondary to blood loss (chronic): Secondary | ICD-10-CM

## 2013-05-09 DIAGNOSIS — Z01419 Encounter for gynecological examination (general) (routine) without abnormal findings: Secondary | ICD-10-CM

## 2013-05-09 MED ORDER — MEDROXYPROGESTERONE ACETATE 10 MG PO TABS: 20.0000 mg | ORAL_TABLET | Freq: Every day | ORAL | Status: DC

## 2013-05-09 NOTE — Progress Notes (Signed)
Subjective:     Patient ID: Miranda Jackson, female   DOB: [birth year], 42 y.o.   MRN: [id number]  HPI Pt presents with c/o large symptomatic uterine fibroids.  Surgery was recommended io the past.  She reports being referred again by her primary care provider for anemia.  She c/o bleeding between cycles.  Past Medical History  Diagnosis Date  . Fibroids   . Anemia    Past Surgical History  Procedure Laterality Date  . Back surgery    . Tubal ligation  1993   Current Outpatient Prescriptions on File Prior to Visit  Medication Sig Dispense Refill  . OVER THE COUNTER MEDICATION Take 1 tablet by mouth daily. Patient takes B 12      . prenatal vitamin w/FE, FA (PRENATAL 1 + 1) 27-1 MG TABS tablet Take 1 tablet by mouth 2 (two) times daily.  30 each  0  . megestrol (MEGACE) 40 MG tablet Take 1 tablet (40 mg total) by mouth daily.  30 tablet  0   No current facility-administered medications on file prior to visit.  Pt NOT taking the megace due to cost  History   Social History  . Marital Status: Married    Spouse Name: N/A    Number of Children: N/A  . Years of Education: N/A   Occupational History  . Not on file.   Social History Main Topics  . Smoking status: Current Every Day Smoker -- 0.50 packs/day    Types: Cigarettes  . Smokeless tobacco: Never Used  . Alcohol Use: 12.6 oz/week    21 Cans of beer per week     Comment: daily  . Drug Use: No  . Sexual Activity: Yes    Birth Control/ Protection: Surgical   Other Topics Concern  . Not on file   Social History Narrative  . No narrative on file   Family History  Problem Relation Age of Onset  . Hypertension Maternal Grandfather   . Heart disease Mother     heart attack     Review of Systems     Objective:   Physical Exam BP 101/62  Pulse 62  Temp(Src) 97.6 F (36.4 C)  Ht 5\' 3"  (1.6 m)  Wt 130 lb 14.4 oz (59.376 kg)  BMI 23.19 kg/m2  LMP 05/07/2013 Pt in NAD Abd: fundal height >6cm above the umbilicus;  nontender; appears mobile. GU: EGBUS: no lesions Vagina: large amount of blood in vault Cervix: no lesion; no mucopurulent d/c Uterus: >26cm; mobile; nontender Adnexa: non tender; unable to adequately assess due to uterine mass   CBC    Component Value Date/Time   WBC 5.1 03/23/2013 1451   RBC 3.26* 03/23/2013 1451   RBC 3.26* 03/23/2013 1451   HGB 8.1* 03/23/2013 1451   HCT 26.8* 03/23/2013 1451   PLT 195 03/23/2013 1451   MCV 82.2 03/23/2013 1451   MCH 24.8* 03/23/2013 1451   MCHC 30.2 03/23/2013 1451   RDW 28.9* 03/23/2013 1451   LYMPHSABS 1.6 02/14/2012 1220   MONOABS 0.5 02/14/2012 1220   EOSABS 0.2 02/14/2012 1220   BASOSABS 0.1 02/14/2012 1220       02/21/2012 Clinical Data: Abdominal pain. Uterine fibroid.  CT ABDOMEN AND PELVIS WITH CONTRAST  Technique: Multidetector CT imaging of the abdomen and pelvis was  performed following the standard protocol during bolus  administration of intravenous contrast.  Contrast: OMNIPAQUE IOHEXOL 300 MG/ML SOLN  Comparison: None.  Findings: The liver, spleen, pancreas, and adrenal glands  appear  unremarkable.  The gallbladder and biliary system appear unremarkable.  Borderline hydronephrosis and mild hydroureter on the right extend  down to the iliac vessel crossover. Numerous large intramural  uterine fibroids are present, with heterogeneous internal  enhancement and some associated calcification. Uterine length is  22.3 cm and uterine volume is estimated at 2000 ml, with the uterus  extending up above the level of the umbilicus.  A low-density 4.1 x 1.9 cm structure displaced into the abdomen on  image 32 of series 2 probably represents the left ovary. Right  ovary is down in the pelvis along the right anterior inferior  margin of the uterus.  Urinary bladder unremarkable. Appendix normal.  IMPRESSION:  1. Multiple large uterine fibroids causing markedly enlarged  uterus extending up above the level of the  umbilicus. Associated  volume is over 2000 ml.  2. Extrinsic mass effect from the uterus is likely causing low-  level partial obstruction of the right ureter at the level of the  iliac vessel crossover  03/14/2013 EXAM:  TRANSABDOMINAL ULTRASOUND OF PELVIS  TECHNIQUE:  Transabdominal ultrasound examination of the pelvis was performed  including evaluation of the uterus, ovaries, adnexal regions, and  pelvic cul-de-sac.  COMPARISON: CT of the abdomen and pelvis on 02/14/2012  FINDINGS:  Uterus  Measurements: Likely greater than 24.6 x 11.9 x 16.7 cm. Multiple  uterine fibroids are identified. The largest discrete measured  fibroids are as follows:  13.6 x 10.9 x 12.8 cm in the posterior uterus  7.3 x 7.3 x 7.6 cm in the fundal region  6.0 x 5.7 x 5.9 cm in the anterior uterus  Endometrium  Thickness: 17.4 mm. There is fluid and echogenic debris within the  endometrial canal. A small hyperechoic mass within the canal  measures 1.4 x 0.6 x 0.7 cm.  Right ovary  The ovary is not visualized, likely because of intervening bowel  loops.  Left ovary  The ovary is not visualized, likely because of intervening bowel  loops.  Other findings: No free fluid  IMPRESSION:  1. Enlarged fibroid/enlarged uterus containing multiple fibroids.  2. Endometrial canal displaced and tortuous, secondary to the  fibroids.  3. Thickened endometrial stripe and focal endometrial lesion.  Consider further evaluation with sonohysterogram for confirmation  prior to hysteroscopy. Endometrial sampling should also be  considered if patient is at high risk for endometrial carcinoma.  (Ref: Radiological Reasoning: Algorithmic Workup of Abnormal Vaginal  Bleeding with Endovaginal Sonography and Sonohysterography. AJR  2008; 161:W96-04)       Assessment:     Symptomatic uterine fibroids- unable to take Megace due to cost  Pt wants definitve treatement      Plan:     F/u PAP F/u for Endo bx (MUST have  prior to surg) Pt want definitive treatment with hyst with bilateral salpingectomy.  Will review presurg info at next visit.  Will schedule now.

## 2013-05-09 NOTE — Patient Instructions (Addendum)
Uterine Fibroid A uterine fibroid is a growth (tumor) that occurs in a woman's uterus. This type of tumor is not cancerous and does not spread out of the uterus. A woman can have one or many fibroids, and the fiboid(s) can become quite large. A fibroid can vary in size, weight, and where it grows in the uterus. Most fibroids do not require medical treatment, but some can cause pain or heavy bleeding during and between periods. CAUSES  A fibroid is the result of a single uterine cell that keeps growing (unregulated), which is different than most cells in the human body. Most cells have a control mechanism that keeps them from reproducing without control.  SYMPTOMS   Bleeding.  Pelvic pain and pressure.  Bladder problems due to the size of the fibroid.  Infertility and miscarriages depending on the size and location of the fibroid. DIAGNOSIS  A diagnosis is made by physical exam. Your caregiver may feel the lumpy tumors during a pelvic exam. Important information regarding size, location, and number of tumors can be gained by having an ultrasound. It is rare that other tests, such as a CT scan or MRI, are needed. TREATMENT   Your caregiver may recommend watchful waiting. This involves getting the fibroid checked by your caregiver to see if the fibroids grow or shrink.   Hormonal treatment or an intrauterine device (IUD) may be prescribed.   Surgery may be needed to remove the fibroids (myomectomy) or the uterus (hysterectomy). This depends on your situation. When fibroids interfere with fertility and a woman wants to become pregnant, a caregiver may recommend having the fibroids removed.  HOME CARE INSTRUCTIONS  Home care depends on how you were treated. In general:   Keep all follow-up appointments with your caregiver.   Only take medicine as told by your caregiver. Do not take aspirin. It can cause bleeding.   If you have excessive periods and soak tampons or pads in a half hour or  less, contact your caregiver immediately. If your periods are troublesome but not so heavy, lie down with your feet raised slightly above your heart. Place cold packs on your lower abdomen.   If your periods are heavy, write down the number of pads or tampons you use per month. Bring this information to your caregiver.   Talk to your caregiver about taking iron pills.   Include green vegetables in your diet.   If you were prescribed a hormonal treatment, take the hormonal medicines as directed.   If you need surgery, ask your caregiver for information on your specific surgery.  SEEK IMMEDIATE MEDICAL CARE IF:  You have pelvic pain or cramps not controlled with medicines.   You have a sudden increase in pelvic pain.   You have an increase of bleeding between and during periods.   You feel lightheaded or have fainting episodes.  MAKE SURE YOU:  Understand these instructions.  Will watch your condition.  Will get help right away if you are not doing well or get worse. Document Released: 04/25/2000 Document Revised: 07/21/2011 Document Reviewed: 11/25/2012 [facility] Patient Information 2014 [place], [place]. Hysterectomy Information  A hysterectomy is a procedure where your uterus is surgically removed. It will no longer be possible to have menstrual periods or to become pregnant. The tubes and ovaries can be removed (bilateral salpingo-oopherectomy) during this surgery as well.  REASONS FOR A HYSTERECTOMY  Persistent, abnormal bleeding.  Lasting (chronic) pelvic pain or infection.  The lining of the uterus (endometrium) starts  growing outside the uterus (endometriosis).  The endometrium starts growing in the muscle of the uterus (adenomyosis).  The uterus falls down into the vagina (pelvic organ prolapse).  Symptomatic uterine fibroids.  Precancerous cells.  Cervical cancer or uterine cancer. TYPES OF HYSTERECTOMIES  Supracervical hysterectomy. This type  removes the top part of the uterus, but not the cervix.  Total hysterectomy. This type removes the uterus and cervix.  Radical hysterectomy. This type removes the uterus, cervix, and the fibrous tissue that holds the uterus in place in the pelvis (parametrium). WAYS A HYSTERECTOMY CAN BE PERFORMED  Abdominal hysterectomy. A large surgical cut (incision) is made in the abdomen. The uterus is removed through this incision.  Vaginal hysterectomy. An incision is made in the vagina. The uterus is removed through this incision. There are no abdominal incisions.  Conventional laparoscopic hysterectomy. A thin, lighted tube with a camera (laparoscope) is inserted into 3 or 4 small incisions in the abdomen. The uterus is cut into small pieces. The small pieces are removed through the incisions, or they are removed through the vagina.  Laparoscopic assisted vaginal hysterectomy (LAVH). Three or four small incisions are made in the abdomen. Part of the surgery is performed laparoscopically and part vaginally. The uterus is removed through the vagina.  Robot-assisted laparoscopic hysterectomy. A laparoscope is inserted into 3 or 4 small incisions in the abdomen. A computer-controlled device is used to give the surgeon a 3D image. This allows for more precise movements of surgical instruments. The uterus is cut into small pieces and removed through the incisions or removed through the vagina. RISKS OF HYSTERECTOMY   Bleeding and risk of blood transfusion. Tell your caregiver if you do not want to receive any blood products.  Blood clots in the legs or lung.  Infection.  Injury to surrounding organs.  Anesthesia problems or side effects.  Conversion to an abdominal hysterectomy. WHAT TO EXPECT AFTER A HYSTERECTOMY  You will be given pain medicine.  You will need to have someone with you for the first 3 to 5 days after you go home.  You will need to follow up with your surgeon in 2 to 4 weeks  after surgery to evaluate your progress.  You may have early menopause symptoms like hot flashes, night sweats, and insomnia.  If you had a hysterectomy for a problem that was not a cancer or a condition that could lead to cancer, then you no longer need Pap tests. However, even if you no longer need a Pap test, a regular exam is a good idea to make sure no other problems are starting. Document Released: 10/22/2000 Document Revised: 07/21/2011 Document Reviewed: 12/07/2010 [facility] Patient Information 2014 [place], [place].

## 2013-05-16 ENCOUNTER — Telehealth: Payer: Self-pay

## 2013-05-16 NOTE — Telephone Encounter (Signed)
Called pt. 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 Informed pt. This meant she just needs to have another pap smear in one year; advised her to call in November to schedule an appointment for pap in December 2015. Pt. Verbalized understanding and had no other questions or concerns.

## 2013-05-16 NOTE — Telephone Encounter (Signed)
Message copied by Geanie Logan on Mon May 16, 2013  4:40 PM ------      Message from: Lavonia Drafts      Created: Fri May 13, 2013  9:31 AM       Please call pt.  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 She needs a PAP in 1 year.            Thx,      clh-S ------

## 2013-05-23 ENCOUNTER — Encounter (HOSPITAL_COMMUNITY): Payer: Self-pay | Admitting: Pharmacist

## 2013-05-25 ENCOUNTER — Other Ambulatory Visit: Payer: No Typology Code available for payment source | Admitting: Obstetrics & Gynecology

## 2013-06-02 ENCOUNTER — Encounter (HOSPITAL_COMMUNITY): Payer: Self-pay

## 2013-06-02 ENCOUNTER — Encounter (HOSPITAL_COMMUNITY)
Admission: RE | Admit: 2013-06-02 | Discharge: 2013-06-02 | Disposition: A | Discharge status: Home / Self Care | Payer: No Typology Code available for payment source | Source: Ambulatory Visit | Attending: Obstetrics & Gynecology | Admitting: Obstetrics & Gynecology

## 2013-06-02 ENCOUNTER — Telehealth: Payer: Self-pay | Admitting: General Practice

## 2013-06-02 ENCOUNTER — Ambulatory Visit: Payer: No Typology Code available for payment source | Admitting: Obstetrics & Gynecology

## 2013-06-02 DIAGNOSIS — Z01818 Encounter for other preprocedural examination: Secondary | ICD-10-CM | POA: Insufficient documentation

## 2013-06-02 DIAGNOSIS — Z01812 Encounter for preprocedural laboratory examination: Secondary | ICD-10-CM | POA: Insufficient documentation

## 2013-06-02 LAB — CBC
HCT: 29.1 % — ABNORMAL LOW (ref 36.0–46.0)
Hemoglobin: 9.4 g/dL — ABNORMAL LOW (ref 12.0–15.0)
MCH: 29.1 pg (ref 26.0–34.0)
MCHC: 32.3 g/dL (ref 30.0–36.0)
MCV: 90.1 fL (ref 78.0–100.0)
Platelets: 166 10*3/uL (ref 150–400)
RBC: 3.23 MIL/uL — AB (ref 3.87–5.11)
RDW: 17.6 % — ABNORMAL HIGH (ref 11.5–15.5)
WBC: 5.4 10*3/uL (ref 4.0–10.5)

## 2013-06-02 NOTE — Telephone Encounter (Signed)
Called patient to inquire if she was coming to her 1:45 appt today with Dr Ihor Dow and patient stated she had no idea about this appt but was here this morning for her pre-op appt and knew nothing about this appt. Asked patient if she could come today because this biopsy has to be done, patient stated she would have to find a ride. Asked patient if she could be here tomorrow morning at 8am for the biopsy with Dr Ihor Dow, patient stated that she would be here. Told patient that if she misses this appt, her surgery will be canceled and will not be done. Patient verbalized understanding and stated she would definitely be here tomorrow. Patient had no further questions

## 2013-06-02 NOTE — Patient Instructions (Addendum)
   Your procedure is scheduled on:  Monday, Jan 26  Enter through the Main Entrance of [facility] at: [place] up the phone at the desk and dial [phone] and inform us of your arrival.  Please call this number if you have any problems the morning of surgery: [phone]  Remember: Do not eat or drink after midnight:  Sunday Take these medicines the morning of surgery with a SIP OF WATER:  None  Do not wear jewelry, make-up, or FINGER nail polish No metal in your hair or on your body. Do not wear lotions, powders, perfumes.  You may wear deodorant.   Do not bring valuables to the hospital. Contacts, dentures or bridgework may not be worn into surgery.  Leave suitcase in the car. After Surgery it may be brought to your room. For patients being admitted to the hospital, checkout time is 11:00am the day of discharge.  Home with husband Phillips Odor  cell [phone].

## 2013-06-03 ENCOUNTER — Ambulatory Visit (INDEPENDENT_AMBULATORY_CARE_PROVIDER_SITE_OTHER): Payer: No Typology Code available for payment source | Admitting: Obstetrics & Gynecology

## 2013-06-03 ENCOUNTER — Other Ambulatory Visit (HOSPITAL_COMMUNITY)
Admission: RE | Admit: 2013-06-03 | Discharge: 2013-06-03 | Disposition: A | Discharge status: Home / Self Care | Payer: No Typology Code available for payment source | Source: Ambulatory Visit | Attending: Obstetrics & Gynecology | Admitting: Obstetrics & Gynecology

## 2013-06-03 ENCOUNTER — Encounter: Payer: Self-pay | Admitting: Obstetrics & Gynecology

## 2013-06-03 VITALS — BP 115/79 | HR 76 | Temp 98.4°F | Ht 63.0 in | Wt 126.9 lb

## 2013-06-03 DIAGNOSIS — N920 Excessive and frequent menstruation with regular cycle: Secondary | ICD-10-CM | POA: Insufficient documentation

## 2013-06-03 DIAGNOSIS — D259 Leiomyoma of uterus, unspecified: Secondary | ICD-10-CM | POA: Insufficient documentation

## 2013-06-03 DIAGNOSIS — N921 Excessive and frequent menstruation with irregular cycle: Secondary | ICD-10-CM

## 2013-06-03 DIAGNOSIS — Z01812 Encounter for preprocedural laboratory examination: Secondary | ICD-10-CM

## 2013-06-03 DIAGNOSIS — D219 Benign neoplasm of connective and other soft tissue, unspecified: Secondary | ICD-10-CM

## 2013-06-03 LAB — POCT PREGNANCY, URINE: PREG TEST UR: NEGATIVE

## 2013-06-03 NOTE — Progress Notes (Addendum)
Subjective:     Patient ID: Miranda Jackson, female   DOB: [birth year], 43 y.o.   MRN: [id number]  HPI Pt presents today for preop Endobx.  H/o menorrhagia and large uterine fibroids.  She reports that she began her menses again yesterday and is bleeding heavily.    Past Medical History  Diagnosis Date  . Fibroids   . Anemia   . SVD (spontaneous vaginal delivery)     x 3   Past Surgical History  Procedure Laterality Date  . Tubal ligation  1993  . Cervical disc surgery  2012     in [place]  . Wisdom tooth extraction  2004     in [place]   Current Outpatient Prescriptions on File Prior to Visit  Medication Sig Dispense Refill  . ferrous fumarate (HEMOCYTE - 106 MG FE) 325 (106 FE) MG TABS tablet Take 1 tablet by mouth 3 (three) times daily.      . Multiple Vitamin (MULTIVITAMIN WITH MINERALS) TABS tablet Take 1 tablet by mouth daily.      Marland Kitchen OVER THE COUNTER MEDICATION Take 1 tablet by mouth daily. Patient takes B 12       No current facility-administered medications on file prior to visit.   No Known Allergies History   Social History  . Marital Status: Married    Spouse Name: N/A    Number of Children: N/A  . Years of Education: N/A   Occupational History  . Not on file.   Social History Main Topics  . Smoking status: Current Every Day Smoker -- 0.50 packs/day for 21 years    Types: Cigarettes  . Smokeless tobacco: Never Used  . Alcohol Use: 25.2 oz/week    42 Cans of beer per week     Comment: daily six pack  . Drug Use: No  . Sexual Activity: Yes    Birth Control/ Protection: Surgical   Other Topics Concern  . Not on file   Social History Narrative  . No narrative on file   Family History  Problem Relation Age of Onset  . Hypertension Maternal Grandfather   . Heart disease Mother     heart attack     Review of Systems     Objective:   Physical Exam BP 115/79  Pulse 76  Temp(Src) 98.4 F (36.9 C) (Oral)  Ht 5\' 3"  (1.6 m)  Wt 126 lb 14.4 oz (57.561  kg)  BMI 22.48 kg/m2  LMP 05/25/2013 Pt in NAD Lungs: CTA CV: RRR Abd: soft, NT, mobile mass noted 5 cm above umbilicus.   The indications for endometrial biopsy were reviewed.   Risks of the biopsy including cramping, bleeding, infection, uterine perforation, inadequate specimen and need for additional procedures  were discussed. The patient states she understands and agrees to undergo procedure today. Consent was signed. Time out was performed. Urine HCG was negative. A sterile speculum was placed in the patient's vagina and the cervix was prepped with Betadine. A single-toothed tenaculum was placed on the anterior lip of the cervix to stabilize it. The 3 mm pipelle was introduced into the endometrial cavity without difficulty to a depth of >13cm, and a moderate amount of tissue was obtained and sent to pathology. The instruments were removed from the patient's vagina. Minimal bleeding from the cervix was noted. The patient tolerated the procedure well.  CBC    Component Value Date/Time   WBC 5.4 06/02/2013 1050   RBC 3.23* 06/02/2013 1050   RBC 3.26* 03/23/2013  1451   HGB 9.4* 06/02/2013 1050   HCT 29.1* 06/02/2013 1050   PLT 166 06/02/2013 1050   MCV 90.1 06/02/2013 1050   MCH 29.1 06/02/2013 1050   MCHC 32.3 06/02/2013 1050   RDW 17.6* 06/02/2013 1050   LYMPHSABS 1.6 02/14/2012 1220   MONOABS 0.5 02/14/2012 1220   EOSABS 0.2 02/14/2012 1220   BASOSABS 0.1 02/14/2012 1220         Assessment:     Menorrhagia thought due to large uterine fibroids.  Pt was told that she could not have surgery unless she had an Endo bx but, she forgot.  Her initial surgery was postponed due to the fact that she did not have an endobx but, she reports that she thought it was due to 'staffing issues'.      Plan:      Routine post-procedure instructions were given to the patient.  F/u results of Endo bx- sent to pathology on [place]  Patient desires surgical management with TAH with bilateral salpingectomy.   The risks of surgery were discussed in detail with the patient including but not limited to: bleeding which may require transfusion or reoperation; infection which may require prolonged hospitalization or re-hospitalization and antibiotic therapy; injury to bowel, bladder, ureters and major vessels or other surrounding organs; need for additional procedures including laparotomy; thromboembolic phenomenon, incisional problems and other postoperative or anesthesia complications.  Patient was told that the likelihood that her condition and symptoms will be treated effectively with this surgical management was very high; the postoperative expectations were also discussed in detail. The patient also understands the alternative treatment options which were discussed in full. All questions were answered.

## 2013-06-03 NOTE — Patient Instructions (Signed)
[facility]  (Hysterectomy, Care After)  Estas indicaciones le proporcionan informacin general acerca de cmo deber cuidarse despus del procedimiento. El mdico tambin podr darle instrucciones especficas. Comunquese con el mdico si tiene algn problema o tiene preguntas despus del procedimiento. CUIDADOS EN EL HOGAR  La curacin puede demorar algn tiempo. Puede sentir molestias, sensibilidad, hinchazn, y hematomas en la zona. Estos sntomas pueden tener una duracin de 2 semanas. Esto es normal e ir mejorando.   Slo tome los medicamentos segn le indique el mdico.  No tome aspirina.  No conduzca mientras toma analgsicos.  Haga ejercicios, levante objetos, conduzca vehculos y vuelva a sus actividades diarias segn le indique su mdico.  Vuelva a su dieta y actividades normales segn lo indicado por el mdico.  Descanse y duerma lo suficiente.  No haga duchas vaginales, no use tampones ni tenga sexo (relaciones sexuales) durante al menos 6 semanas o segn las indicaciones.  Cambie el vendaje tal como se le indic.  Tmese la Counsellor.  Tome duchas durante 2 a 3 semanas. No tome baos.  No beba alcohol hasta que el mdico lo autorice.  Tome Medical sales representative (laxante) que la ayude a Film/video editor intestino segn las indicaciones del mdico. Consuma alimentos que contengan salvado. Beba gran cantidad de lquido para mantener la orina de tono claro o color amarillo plido.  Pdale a alguna persona que la ayude con las tareas del hogar durante 1 a 2 semanas despus de la [place].  Cumpla con los controles mdicos segn las indicaciones. SOLICITE AYUDA DE INMEDIATO SI:   Tiene fiebre.  Siente un dolor en el vientre (abdominal) muy intenso.  Siente dolor en el pecho.  Comienza a sentir falta de [place].  Se desmaya (se desvanece).  Siente dolor, u observa hinchazn o enrojecimiento en la pierna.  Tiene sangrado por la vagina y  nota cogulos de tejido.  Tiene hinchazn, enrojecimiento o Management consultant de insercin de la va intravenosa o en la zona de la herida.  Observa una secrecin de color blanco amarillento (pus) en la herida.  Advierte un olor ftido que proviene de la herida o del vendaje.  La herida se abre.  Se siente mareado o sufre un desmayo.  Siente dolor o tiene una hemorragia al [employer].  La materia fecal es lquida (diarrea).  Tiene Higher education careers adviser (nuseas) y vmitos.  Observa una secrecin (prdida) que proviene de la vagina.  Tiene una erupcin.  Tiene algn problema o sufre alguna reaccin a los medicamentos.  Necesita analgsicos ms fuertes. ASEGRESE DE QUE:   Comprende estas instrucciones.  Controlar su enfermedad.  Solicitar ayuda de inmediato si no mejora o si empeora. Document Released: 04/17/2011 Document Revised: 07/21/2011 [facility] Patient Information 2014 [place], [place]. Menorrhagia Menorrhagia is the medical term for when your menstrual periods are heavy or last longer than usual. With menorrhagia, every period you have may cause enough blood loss and cramping that you are unable to maintain your usual activities. CAUSES  In some cases, the cause of heavy periods is unknown, but a number of conditions may cause menorrhagia. Common causes include:  A problem with the hormone-producing thyroid gland (hypothyroid).  Noncancerous growths in the uterus (polyps or fibroids).  An imbalance of the estrogen and progesterone hormones.  One of your ovaries not releasing an egg during one or more months.  Side effects of having an intrauterine device (IUD).  Side effects of some medicines, such as anti-inflammatory medicines or blood  thinners.  A bleeding disorder that stops your blood from clotting normally. SIGNS AND SYMPTOMS  During a normal period, bleeding lasts between 4 and 8 days. Signs that your periods are too heavy include:  You routinely have to  change your pad or tampon every 1 or 2 hours because it is completely soaked.  You pass blood clots larger than 1 inch (2.5 cm) in size.  You have bleeding for more than 7 days.  You need to use pads and tampons at the same time because of heavy bleeding.  You need to wake up to change your pads or tampons during the night.  You have symptoms of anemia, such as tiredness, fatigue, or shortness of breath. DIAGNOSIS  Your health care provider will perform a physical exam and ask you questions about your symptoms and menstrual history. Other tests may be ordered based on what the health care provider finds during the exam. These tests can include:  Blood tests To check if you are pregnant or have hormonal changes, a bleeding or thyroid disorder, low iron levels (anemia), or other problems.  Endometrial biopsy Your health care provider takes a sample of tissue from the inside of your uterus to be examined under a microscope.  Pelvic ultrasound This test uses sound waves to make a picture of your uterus, ovaries, and vagina. The pictures can show if you have fibroids or other growths.  Hysteroscopy For this test, your health care provider will use a small telescope to look inside your uterus. Based on the results of your initial tests, your health care provider may recommend further testing. TREATMENT  Treatment may not be needed. If it is needed, your health care provider may recommend treatment with one or more medicines first. If these do not reduce bleeding enough, a surgical treatment might be an option. The best treatment for you will depend on:   Whether you need to prevent pregnancy.  Your desire to have children in the future.  The cause and severity of your bleeding.  Your opinion and personal preference.  Medicines for menorrhagia may include:  Birth control methods that use hormones These include the pill, skin patch, vaginal ring, shots that you get every 3 months,  hormonal IUD, and implant. These treatments reduce bleeding during your menstrual period.  Medicines that thicken blood and slow bleeding.  Medicines that reduce swelling, such as ibuprofen.  Medicines that contain a synthetic hormone called progestin.   Medicines that make the ovaries stop working for a short time.  You may need surgical treatment for menorrhagia if the medicines are unsuccessful. Treatment options include:  Dilation and curettage (D&C) In this procedure, your health care provider opens (dilates) your cervix and then scrapes or suctions tissue from the lining of your uterus to reduce menstrual bleeding.  Operative hysteroscopy This procedure uses a tiny tube with a light (hysteroscope) to view your uterine cavity and can help in the surgical removal of a polyp that may be causing heavy periods.  Endometrial ablation Through various techniques, your health care provider permanently destroys the entire lining of your uterus (endometrium). After endometrial ablation, most women have little or no menstrual flow. Endometrial ablation reduces your ability to become pregnant.  Endometrial resection This surgical procedure uses an electrosurgical wire loop to remove the lining of the uterus. This procedure also reduces your ability to become pregnant.  Hysterectomy Surgical removal of the uterus and cervix is a permanent procedure that stops menstrual periods. Pregnancy is not  possible after a hysterectomy. This procedure requires anesthesia and hospitalization. HOME CARE INSTRUCTIONS   Only take over-the-counter or prescription medicines as directed by your health care provider. Take prescribed medicines exactly as directed. Do not change or switch medicines without consulting your health care provider.  Take any prescribed iron pills exactly as directed by your health care provider. Long-term heavy bleeding may result in low iron levels. Iron pills help replace the iron your  body lost from heavy bleeding. Iron may cause constipation. If this becomes a problem, increase the bran, fruits, and roughage in your diet.  Do not take aspirin or medicines that contain aspirin 1 week before or during your menstrual period. Aspirin may make the bleeding worse.  If you need to change your sanitary pad or tampon more than once every 2 hours, stay in bed and rest as much as possible until the bleeding stops.  Eat well-balanced meals. Eat foods high in iron. Examples are leafy green vegetables, meat, liver, eggs, and whole grain breads and cereals. Do not try to lose weight until the abnormal bleeding has stopped and your blood iron level is back to normal. SEEK MEDICAL CARE IF:   You soak through a pad or tampon every 1 or 2 hours, and this happens every time you have a period.  You need to use pads and tampons at the same time because you are bleeding so much.  You need to change your pad or tampon during the night.  You have a period that lasts for more than 8 days.  You pass clots bigger than 1 inch wide.  You have irregular periods that happen more or less often than once a month.  You feel dizzy or faint.  You feel very weak or tired.  You feel short of breath or feel your heart is beating too fast when you exercise.  You have nausea and vomiting or diarrhea while you are taking your medicine.  You have any problems that may be related to the medicine you are taking. SEEK IMMEDIATE MEDICAL CARE IF:   You soak through 4 or more pads or tampons in 2 hours.  You have any bleeding while you are pregnant. MAKE SURE YOU:   Understand these instructions.  Will watch your condition.  Will get help right away if you are not doing well or get worse. Document Released: 04/28/2005 Document Revised: 02/16/2013 Document Reviewed: 10/17/2012 [facility] Patient Information 2014 [place].

## 2013-06-06 ENCOUNTER — Encounter (HOSPITAL_COMMUNITY): Payer: Self-pay | Admitting: *Deleted

## 2013-06-06 ENCOUNTER — Encounter (HOSPITAL_COMMUNITY): Payer: No Typology Code available for payment source | Admitting: Anesthesiology

## 2013-06-06 ENCOUNTER — Inpatient Hospital Stay (HOSPITAL_COMMUNITY)
Admission: RE | Admit: 2013-06-06 | Discharge: 2013-06-07 | DRG: 743 | Disposition: A | Discharge status: Home / Self Care | Payer: No Typology Code available for payment source | Source: Ambulatory Visit | Attending: Obstetrics & Gynecology | Admitting: Obstetrics & Gynecology

## 2013-06-06 ENCOUNTER — Encounter (HOSPITAL_COMMUNITY)
Admission: RE | Disposition: A | Discharge status: Home / Self Care | Payer: Self-pay | Source: Ambulatory Visit | Attending: Obstetrics & Gynecology

## 2013-06-06 ENCOUNTER — Inpatient Hospital Stay (HOSPITAL_COMMUNITY): Payer: No Typology Code available for payment source | Admitting: Anesthesiology

## 2013-06-06 DIAGNOSIS — N920 Excessive and frequent menstruation with regular cycle: Secondary | ICD-10-CM | POA: Diagnosis present

## 2013-06-06 DIAGNOSIS — D252 Subserosal leiomyoma of uterus: Secondary | ICD-10-CM | POA: Diagnosis present

## 2013-06-06 DIAGNOSIS — D259 Leiomyoma of uterus, unspecified: Secondary | ICD-10-CM

## 2013-06-06 DIAGNOSIS — F17200 Nicotine dependence, unspecified, uncomplicated: Secondary | ICD-10-CM | POA: Diagnosis present

## 2013-06-06 DIAGNOSIS — D649 Anemia, unspecified: Secondary | ICD-10-CM

## 2013-06-06 DIAGNOSIS — D251 Intramural leiomyoma of uterus: Principal | ICD-10-CM | POA: Diagnosis present

## 2013-06-06 DIAGNOSIS — Z9889 Other specified postprocedural states: Secondary | ICD-10-CM

## 2013-06-06 HISTORY — PX: ABDOMINAL HYSTERECTOMY: SHX81

## 2013-06-06 HISTORY — PX: BILATERAL SALPINGECTOMY: SHX5743

## 2013-06-06 LAB — CBC
HCT: 26.2 % — ABNORMAL LOW (ref 36.0–46.0)
HCT: 30.1 % — ABNORMAL LOW (ref 36.0–46.0)
HEMOGLOBIN: 8.4 g/dL — AB (ref 12.0–15.0)
Hemoglobin: 9.7 g/dL — ABNORMAL LOW (ref 12.0–15.0)
MCH: 29 pg (ref 26.0–34.0)
MCH: 28.1 pg (ref 26.0–34.0)
MCHC: 32.1 g/dL (ref 30.0–36.0)
MCHC: 32.2 g/dL (ref 30.0–36.0)
MCV: 90.3 fL (ref 78.0–100.0)
MCV: 87.2 fL (ref 78.0–100.0)
Platelets: 222 10*3/uL (ref 150–400)
Platelets: 194 10*3/uL (ref 150–400)
RBC: 2.9 MIL/uL — AB (ref 3.87–5.11)
RBC: 3.45 MIL/uL — ABNORMAL LOW (ref 3.87–5.11)
RDW: 17.4 % — ABNORMAL HIGH (ref 11.5–15.5)
RDW: 18.7 % — AB (ref 11.5–15.5)
WBC: 4.4 10*3/uL (ref 4.0–10.5)
WBC: 16.1 10*3/uL — ABNORMAL HIGH (ref 4.0–10.5)

## 2013-06-06 LAB — PREPARE RBC (CROSSMATCH)

## 2013-06-06 LAB — ABO/RH: ABO/RH(D): A POS

## 2013-06-06 SURGERY — HYSTERECTOMY, ABDOMINAL
Anesthesia: General | Site: Abdomen

## 2013-06-06 MED ORDER — LACTATED RINGERS IV SOLN
INTRAVENOUS | Status: DC
  Administered 2013-06-06 (×3): via INTRAVENOUS

## 2013-06-06 MED ORDER — KETOROLAC TROMETHAMINE 30 MG/ML IJ SOLN: 30.0000 mg | Freq: Once | INTRAMUSCULAR | Status: DC

## 2013-06-06 MED ORDER — SODIUM CHLORIDE 0.9 % IJ SOLN
INTRAMUSCULAR | Status: AC
  Filled 2013-06-06: qty 20

## 2013-06-06 MED ORDER — KETOROLAC TROMETHAMINE 30 MG/ML IJ SOLN
INTRAMUSCULAR | Status: AC
  Filled 2013-06-06: qty 1

## 2013-06-06 MED ORDER — DIPHENHYDRAMINE HCL 12.5 MG/5ML PO ELIX: 12.5000 mg | ORAL_SOLUTION | Freq: Four times a day (QID) | ORAL | Status: DC | PRN

## 2013-06-06 MED ORDER — DEXAMETHASONE SODIUM PHOSPHATE 10 MG/ML IJ SOLN
INTRAMUSCULAR | Status: AC
  Filled 2013-06-06: qty 1

## 2013-06-06 MED ORDER — ACETAMINOPHEN 160 MG/5ML PO SOLN
ORAL | Status: AC
  Filled 2013-06-06: qty 40.6

## 2013-06-06 MED ORDER — ONDANSETRON HCL 4 MG PO TABS: 4.0000 mg | ORAL_TABLET | Freq: Four times a day (QID) | ORAL | Status: DC | PRN

## 2013-06-06 MED ORDER — NALOXONE HCL 0.4 MG/ML IJ SOLN: 0.4000 mg | INTRAMUSCULAR | Status: DC | PRN

## 2013-06-06 MED ORDER — BISACODYL 10 MG RE SUPP: 10.0000 mg | Freq: Every day | RECTAL | Status: DC | PRN

## 2013-06-06 MED ORDER — PANTOPRAZOLE SODIUM 40 MG IV SOLR
40.0000 mg | Freq: Every day | INTRAVENOUS | Status: DC
  Administered 2013-06-06: 40 mg via INTRAVENOUS
  Filled 2013-06-06 (×2): qty 40

## 2013-06-06 MED ORDER — SODIUM CHLORIDE 0.9 % IJ SOLN: 9.0000 mL | INTRAMUSCULAR | Status: DC | PRN

## 2013-06-06 MED ORDER — NEOSTIGMINE METHYLSULFATE 1 MG/ML IJ SOLN
INTRAMUSCULAR | Status: AC
  Filled 2013-06-06: qty 1

## 2013-06-06 MED ORDER — BUPIVACAINE LIPOSOME 1.3 % IJ SUSP
INTRAMUSCULAR | Status: DC | PRN
  Administered 2013-06-06: 20 mL

## 2013-06-06 MED ORDER — BUPIVACAINE LIPOSOME 1.3 % IJ SUSP
20.0000 mL | Freq: Once | INTRAMUSCULAR | Status: DC
Start: 2013-06-06 — End: 2013-06-06
  Filled 2013-06-06: qty 20

## 2013-06-06 MED ORDER — ACETAMINOPHEN 160 MG/5ML PO SOLN
975.0000 mg | Freq: Once | ORAL | Status: AC
  Administered 2013-06-06: 975 mg via ORAL

## 2013-06-06 MED ORDER — ONDANSETRON HCL 4 MG/2ML IJ SOLN
INTRAMUSCULAR | Status: DC | PRN
  Administered 2013-06-06: 4 mg via INTRAVENOUS

## 2013-06-06 MED ORDER — PROPOFOL 10 MG/ML IV EMUL
INTRAVENOUS | Status: AC
  Filled 2013-06-06: qty 20

## 2013-06-06 MED ORDER — DEXTROSE-NACL 5-0.45 % IV SOLN
INTRAVENOUS | Status: DC
  Administered 2013-06-06 – 2013-06-07 (×2): via INTRAVENOUS

## 2013-06-06 MED ORDER — SODIUM CHLORIDE 0.9 % IV SOLN
INTRAVENOUS | Status: DC | PRN
  Administered 2013-06-06: 12:00:00 via INTRAVENOUS

## 2013-06-06 MED ORDER — ONDANSETRON HCL 4 MG/2ML IJ SOLN
INTRAMUSCULAR | Status: AC
  Filled 2013-06-06: qty 2

## 2013-06-06 MED ORDER — DOCUSATE SODIUM 100 MG PO CAPS
100.0000 mg | ORAL_CAPSULE | Freq: Two times a day (BID) | ORAL | Status: DC
  Administered 2013-06-06 – 2013-06-07 (×2): 100 mg via ORAL
  Filled 2013-06-06 (×3): qty 1

## 2013-06-06 MED ORDER — DEXAMETHASONE SODIUM PHOSPHATE 10 MG/ML IJ SOLN
INTRAMUSCULAR | Status: DC | PRN
  Administered 2013-06-06: 10 mg via INTRAVENOUS

## 2013-06-06 MED ORDER — CEFAZOLIN SODIUM-DEXTROSE 2-3 GM-% IV SOLR
2.0000 g | INTRAVENOUS | Status: AC
  Administered 2013-06-06: 2 g via INTRAVENOUS

## 2013-06-06 MED ORDER — MORPHINE SULFATE (PF) 1 MG/ML IV SOLN
INTRAVENOUS | Status: DC
  Administered 2013-06-06: 14:00:00 via INTRAVENOUS
  Filled 2013-06-06: qty 25

## 2013-06-06 MED ORDER — KETOROLAC TROMETHAMINE 30 MG/ML IJ SOLN
30.0000 mg | Freq: Once | INTRAMUSCULAR | Status: AC
  Administered 2013-06-06: 30 mg via INTRAVENOUS

## 2013-06-06 MED ORDER — FENTANYL CITRATE 0.05 MG/ML IJ SOLN
INTRAMUSCULAR | Status: AC
  Filled 2013-06-06: qty 2

## 2013-06-06 MED ORDER — OXYCODONE-ACETAMINOPHEN 5-325 MG PO TABS
1.0000 | ORAL_TABLET | ORAL | Status: DC | PRN
  Administered 2013-06-07: 2 via ORAL
  Filled 2013-06-06: qty 2

## 2013-06-06 MED ORDER — ROCURONIUM BROMIDE 100 MG/10ML IV SOLN
INTRAVENOUS | Status: AC
  Filled 2013-06-06: qty 1

## 2013-06-06 MED ORDER — MENTHOL 3 MG MT LOZG: 1.0000 | LOZENGE | OROMUCOSAL | Status: DC | PRN

## 2013-06-06 MED ORDER — MIDAZOLAM HCL 2 MG/2ML IJ SOLN
INTRAMUSCULAR | Status: DC | PRN
  Administered 2013-06-06: 2 mg via INTRAVENOUS

## 2013-06-06 MED ORDER — FENTANYL CITRATE 0.05 MG/ML IJ SOLN
25.0000 ug | INTRAMUSCULAR | Status: DC | PRN
  Administered 2013-06-06 (×2): 25 ug via INTRAVENOUS

## 2013-06-06 MED ORDER — KETOROLAC TROMETHAMINE 30 MG/ML IJ SOLN: 30.0000 mg | Freq: Three times a day (TID) | INTRAMUSCULAR | Status: DC

## 2013-06-06 MED ORDER — HYDROMORPHONE HCL PF 1 MG/ML IJ SOLN
INTRAMUSCULAR | Status: AC
  Filled 2013-06-06: qty 1

## 2013-06-06 MED ORDER — PROPOFOL 10 MG/ML IV BOLUS
INTRAVENOUS | Status: DC | PRN
  Administered 2013-06-06: 200 mg via INTRAVENOUS

## 2013-06-06 MED ORDER — ONDANSETRON HCL 4 MG/2ML IJ SOLN: 4.0000 mg | Freq: Four times a day (QID) | INTRAMUSCULAR | Status: DC | PRN

## 2013-06-06 MED ORDER — FENTANYL CITRATE 0.05 MG/ML IJ SOLN
INTRAMUSCULAR | Status: DC | PRN
  Administered 2013-06-06: 50 ug via INTRAVENOUS
  Administered 2013-06-06 (×2): 100 ug via INTRAVENOUS

## 2013-06-06 MED ORDER — PHENYLEPHRINE HCL 10 MG/ML IJ SOLN
INTRAMUSCULAR | Status: DC | PRN
  Administered 2013-06-06: 80 ug via INTRAVENOUS
  Administered 2013-06-06 (×2): 40 ug via INTRAVENOUS

## 2013-06-06 MED ORDER — FENTANYL CITRATE 0.05 MG/ML IJ SOLN
INTRAMUSCULAR | Status: AC
  Filled 2013-06-06: qty 5

## 2013-06-06 MED ORDER — KETOROLAC TROMETHAMINE 30 MG/ML IJ SOLN
30.0000 mg | Freq: Three times a day (TID) | INTRAMUSCULAR | Status: DC
  Administered 2013-06-06: 30 mg via INTRAVENOUS
  Filled 2013-06-06: qty 1

## 2013-06-06 MED ORDER — IBUPROFEN 800 MG PO TABS
800.0000 mg | ORAL_TABLET | Freq: Four times a day (QID) | ORAL | Status: DC
  Administered 2013-06-07 (×2): 800 mg via ORAL
  Filled 2013-06-06 (×2): qty 1

## 2013-06-06 MED ORDER — DIPHENHYDRAMINE HCL 50 MG/ML IJ SOLN: 12.5000 mg | Freq: Four times a day (QID) | INTRAMUSCULAR | Status: DC | PRN

## 2013-06-06 MED ORDER — LIDOCAINE HCL (CARDIAC) 20 MG/ML IV SOLN
INTRAVENOUS | Status: DC | PRN
  Administered 2013-06-06: 80 mg via INTRAVENOUS

## 2013-06-06 MED ORDER — ZOLPIDEM TARTRATE 5 MG PO TABS: 5.0000 mg | ORAL_TABLET | Freq: Every evening | ORAL | Status: DC | PRN

## 2013-06-06 MED ORDER — CEFAZOLIN SODIUM-DEXTROSE 2-3 GM-% IV SOLR
INTRAVENOUS | Status: AC
  Filled 2013-06-06: qty 50

## 2013-06-06 MED ORDER — LIDOCAINE HCL (CARDIAC) 20 MG/ML IV SOLN
INTRAVENOUS | Status: AC
  Filled 2013-06-06: qty 5

## 2013-06-06 MED ORDER — NEOSTIGMINE METHYLSULFATE 1 MG/ML IJ SOLN
INTRAMUSCULAR | Status: DC | PRN
  Administered 2013-06-06: 3 mg via INTRAVENOUS

## 2013-06-06 MED ORDER — MIDAZOLAM HCL 2 MG/2ML IJ SOLN
INTRAMUSCULAR | Status: AC
  Filled 2013-06-06: qty 2

## 2013-06-06 MED ORDER — LACTATED RINGERS IV SOLN
INTRAVENOUS | Status: DC
  Administered 2013-06-06: 13:00:00 via INTRAVENOUS

## 2013-06-06 MED ORDER — GLYCOPYRROLATE 0.2 MG/ML IJ SOLN
INTRAMUSCULAR | Status: AC
  Filled 2013-06-06: qty 4

## 2013-06-06 MED ORDER — GLYCOPYRROLATE 0.2 MG/ML IJ SOLN
INTRAMUSCULAR | Status: DC | PRN
  Administered 2013-06-06: 0.1 mg via INTRAVENOUS
  Administered 2013-06-06: 0.4 mg via INTRAVENOUS

## 2013-06-06 MED ORDER — ALUM & MAG HYDROXIDE-SIMETH 200-200-20 MG/5ML PO SUSP: 30.0000 mL | ORAL | Status: DC | PRN

## 2013-06-06 MED ORDER — HYDROMORPHONE HCL PF 1 MG/ML IJ SOLN
0.2500 mg | INTRAMUSCULAR | Status: DC | PRN
  Administered 2013-06-06: 0.5 mg via INTRAVENOUS

## 2013-06-06 MED ORDER — ROCURONIUM BROMIDE 100 MG/10ML IV SOLN
INTRAVENOUS | Status: DC | PRN
  Administered 2013-06-06: 10 mg via INTRAVENOUS
  Administered 2013-06-06: 40 mg via INTRAVENOUS

## 2013-06-06 SURGICAL SUPPLY — 52 items
BARRIER ADHS 3X4 INTERCEED (GAUZE/BANDAGES/DRESSINGS) IMPLANT
BENZOIN TINCTURE PRP APPL 2/3 (GAUZE/BANDAGES/DRESSINGS) ×4 IMPLANT
BINDER ABD UNIV 12 45-62 (WOUND CARE) IMPLANT
BINDER ABDOMINAL 46IN 62IN (WOUND CARE)
BLADE SURG ROTATE 9660 (MISCELLANEOUS) ×4 IMPLANT
CANISTER SUCT 3000ML (MISCELLANEOUS) ×4 IMPLANT
CELLS DAT CNTRL 66122 CELL SVR (MISCELLANEOUS) IMPLANT
CHLORAPREP W/TINT 26ML (MISCELLANEOUS) ×4 IMPLANT
CLOSURE WOUND 1/2 X4 (GAUZE/BANDAGES/DRESSINGS) ×1
CLOTH BEACON ORANGE TIMEOUT ST (SAFETY) ×4 IMPLANT
CONT PATH 16OZ SNAP LID 3702 (MISCELLANEOUS) ×4 IMPLANT
DECANTER SPIKE VIAL GLASS SM (MISCELLANEOUS) ×4 IMPLANT
DRAPE WARM FLUID 44X44 (DRAPE) IMPLANT
DRSG TELFA 3X8 NADH (GAUZE/BANDAGES/DRESSINGS) ×8 IMPLANT
GAUZE SPONGE 4X4 16PLY XRAY LF (GAUZE/BANDAGES/DRESSINGS) ×4 IMPLANT
GLOVE BIO SURGEON STRL SZ7 (GLOVE) ×4 IMPLANT
GLOVE BIOGEL M 7.0 STRL (GLOVE) ×4 IMPLANT
GLOVE BIOGEL PI IND STRL 7.0 (GLOVE) ×2 IMPLANT
GLOVE BIOGEL PI INDICATOR 7.0 (GLOVE) ×2
GOWN STRL REUS W/ TWL XL LVL3 (GOWN DISPOSABLE) ×2 IMPLANT
GOWN STRL REUS W/TWL LRG LVL3 (GOWN DISPOSABLE) ×4 IMPLANT
GOWN STRL REUS W/TWL XL LVL3 (GOWN DISPOSABLE) ×2
HEMOSTAT SURGICEL 4X8 (HEMOSTASIS) ×4 IMPLANT
NEEDLE HYPO 22GX1.5 SAFETY (NEEDLE) ×4 IMPLANT
NS IRRIG 1000ML POUR BTL (IV SOLUTION) ×4 IMPLANT
PACK ABDOMINAL GYN (CUSTOM PROCEDURE TRAY) ×4 IMPLANT
PAD ABD 7.5X8 STRL (GAUZE/BANDAGES/DRESSINGS) ×8 IMPLANT
PAD OB MATERNITY 4.3X12.25 (PERSONAL CARE ITEMS) ×4 IMPLANT
PROTECTOR NERVE ULNAR (MISCELLANEOUS) ×4 IMPLANT
RETRACTOR WND ALEXIS 25 LRG (MISCELLANEOUS) IMPLANT
RTRCTR WOUND ALEXIS 18CM MED (MISCELLANEOUS)
RTRCTR WOUND ALEXIS 25CM LRG (MISCELLANEOUS)
SEALER TISSUE X1 CVD JAW (INSTRUMENTS) ×4 IMPLANT
SPONGE GAUZE 4X4 12PLY (GAUZE/BANDAGES/DRESSINGS) ×4 IMPLANT
SPONGE LAP 18X18 X RAY DECT (DISPOSABLE) ×16 IMPLANT
STAPLER VISISTAT 35W (STAPLE) ×4 IMPLANT
STRIP CLOSURE SKIN 1/2X4 (GAUZE/BANDAGES/DRESSINGS) ×3 IMPLANT
SUT VIC AB 0 CT1 18XCR BRD8 (SUTURE) ×8 IMPLANT
SUT VIC AB 0 CT1 27 (SUTURE) ×4
SUT VIC AB 0 CT1 27XBRD ANBCTR (SUTURE) ×4 IMPLANT
SUT VIC AB 0 CT1 36 (SUTURE) ×4 IMPLANT
SUT VIC AB 0 CT1 8-18 (SUTURE) ×8
SUT VIC AB 3-0 CT1 27 (SUTURE) ×2
SUT VIC AB 3-0 CT1 TAPERPNT 27 (SUTURE) ×2 IMPLANT
SUT VIC AB 3-0 SH 27 (SUTURE)
SUT VIC AB 3-0 SH 27X BRD (SUTURE) IMPLANT
SUT VIC AB 4-0 KS 27 (SUTURE) ×4 IMPLANT
SUT VICRYL 0 TIES 12 18 (SUTURE) ×4 IMPLANT
SYR CONTROL 10ML LL (SYRINGE) ×4 IMPLANT
TOWEL OR 17X24 6PK STRL BLUE (TOWEL DISPOSABLE) ×8 IMPLANT
TRAY FOLEY CATH 14FR (SET/KITS/TRAYS/PACK) ×4 IMPLANT
WATER STERILE IRR 1000ML POUR (IV SOLUTION) ×4 IMPLANT

## 2013-06-06 NOTE — Op Note (Signed)
06/06/2013  12:36 PM  PATIENT:  Miranda Jackson  43 y.o. female  PRE-OPERATIVE DIAGNOSIS:   Symptomatic uterine fibroids  POST-OPERATIVE DIAGNOSIS:   Symptomatic uterine fibroids  PROCEDURE:  Procedure(s): HYSTERECTOMY ABDOMINAL (N/A) BILATERAL SALPINGECTOMY (Bilateral)  SURGEON:  Surgeon(s) and Role:    * Lavonia Drafts, MD - Primary    * Woodroe Mode, MD - Assisting  ANESTHESIA:   local and general  EBL:  Total I/O In: 3900 [I.V.:3200; Blood:700] Out: 800 [Urine:150; Blood:650]  BLOOD ADMINISTERED:700 CC PRBC  DRAINS: none   LOCAL MEDICATIONS USED:  OTHER Exparel 20cc  SPECIMEN:  Source of Specimen:  uterus, cervix and bilateral fallopian tubes  DISPOSITION OF SPECIMEN:  PATHOLOGY  COUNTS:  YES  TOURNIQUET:  * No tourniquets in log *  DICTATION: .Note written in EPIC  PLAN OF CARE: Admit to inpatient   PATIENT DISPOSITION:  PACU - hemodynamically stable.   Delay start of Pharmacological VTE agent (>24hrs) due to surgical blood loss or risk of bleeding: yes  INDICATIONS: The patient is a 43 yo P3 with the menorrhagia and large uterine fibroids who desires definitive surgical management. On the preoperative visit, the risks, benefits, indications, and alternatives of the procedure were reviewed with the patient. On the day of surgery, the risks of surgery were again discussed with the patient including but not limited to: bleeding which may require transfusion or reoperation; infection which may require antibiotics; injury to bowel, bladder, ureters or other surrounding organs; need for additional procedures; thromboembolic phenomenon, incisional problems and other postoperative/anesthesia complications. Written informed consent was obtained. All questions were answered.  OPERATIVE FINDINGS: A 26 week sized fibroids uterus with normal tubes and ovaries bilaterally.  SPECIMENS: Uterus,cervix, bilateral fallopian tubes sent to pathology  COMPLICATIONS: none.   DESCRIPTION OF PROCEDURE: The patient received intravenous antibiotics and had sequential compression devices applied to her lower extremities while in the preoperative area. She was taken to the operating room and placed under general anesthesia without difficulty. The abdomen, vagina and perineum were prepped and draped in a sterile manner, and she was placed in a dorsal supine position. A Foley catheter was inserted into the bladder and attached to constant drainage. After an adequate timeout was performed, a transverse skin incision was made. This incision was taken down to the fascia using a scalpel and cautery with care given to maintain good hemostasis. The fascia was grasped with kocher clamps, tented up and the rectus muscles were dissected off using sharp and blunt dissection on the superior and inferior aspects of the fascial incision. The peritoneum entered sharply without complication. This peritoneal cavity was entered digitally. Attention was then turned to the pelvis. The uterus at this point was noted to be mobilized and was delivered up out of the abdomen. The bowel was packed away with moist laparotomy sponges. The round ligaments on each side were clamped and transected using the Enseal harmonic device, allowing entry into the broad ligament. At this point the fallopian tubes were grasped with a Babcock clamp.  Using the Enseal device, the mesosalpinx was clamped cut and ligated, excellent hemostasis was noted. The anterior and posterior leaves of the broad ligament were separated, and the ureters were inspected to be safely away from the area of dissection bilaterally. The uteroovarian ligaments were bliaterally clamped with the Enseal. The uterine arteries were skeletinized bilaterally A bladder flap was then created. The bladder was then bluntly dissected off the lower uterine segment and cervix with good hemostasis noted. The uterine  arteries were then skeletonized bilaterally and then  clamped, cut, and doubly suture ligated with care given to prevent ureteral injury. Of note, all sutures used in this procedure are 0 Vicryl unless otherwise noted. After the uterine arteries were clamped, the uterus was removed with a scalpel and the cervical stump was grasped with a Jacob's tenaculum. The uterosacral ligaments were then clamped, cut, and ligated bilaterally. Finally, the cardinal ligaments were clamped, cut, and ligated bilaterally. The uterus was removed.  The cuff angles were closed with multiple figure-of-eight sutures and the remainder of the cuff was closed with 0 vicryl in interrupted sutures. The pelvis was irrigated and hemostasis was reconfirmed at all pedicles and along the pelvic sidewall.  All laparotomy sponges and instruments were removed from the abdomen. Surgicel was placed over the vaginal cuff and the pedicles after the pelvis was irrigated.  The peritoneum was reapproximated with 0 vicryl.  The fascia was closed with 0 vicryl . The skin was reapproximated with a subcuticular suture of 4-0 vicryl. The incision was injected with 20cc of Exparel mixed with 20cc of NS. Benzoin and steristrips were applied.  Sponge, lap and needle counts were correct times two. The patient was taken to the recovery area awake, extubated and in stable condition.

## 2013-06-06 NOTE — Brief Op Note (Signed)
06/06/2013  12:36 PM  PATIENT:  Miranda Jackson  43 y.o. female  PRE-OPERATIVE DIAGNOSIS:   Symptomatic uterine fibroids  POST-OPERATIVE DIAGNOSIS:   Symptomatic uterine fibroids  PROCEDURE:  Procedure(s): HYSTERECTOMY ABDOMINAL (N/A) BILATERAL SALPINGECTOMY (Bilateral)  SURGEON:  Surgeon(s) and Role:    * Lavonia Drafts, MD - Primary    * Woodroe Mode, MD - Assisting  ANESTHESIA:   local and general  EBL:  Total I/O In: 3900 [I.V.:3200; Blood:700] Out: 800 [Urine:150; Blood:650]  BLOOD ADMINISTERED:700 CC PRBC  DRAINS: none   LOCAL MEDICATIONS USED:  OTHER Exparel 20cc  SPECIMEN:  Source of Specimen:  uterus, cervix and bilateral fallopian tubes  DISPOSITION OF SPECIMEN:  PATHOLOGY  COUNTS:  YES  TOURNIQUET:  * No tourniquets in log *  DICTATION: .Note written in EPIC  PLAN OF CARE: Admit to inpatient   PATIENT DISPOSITION:  PACU - hemodynamically stable.   Delay start of Pharmacological VTE agent (>24hrs) due to surgical blood loss or risk of bleeding: yes

## 2013-06-06 NOTE — H&P (Signed)
  Call from pathology this am: surg path from Endobx- neg for cancer or dysplasia.  CBC    Component Value Date/Time   WBC 4.4 06/06/2013 0705   RBC 2.90* 06/06/2013 0705   RBC 3.26* 03/23/2013 1451   HGB 8.4* 06/06/2013 0705   HCT 26.2* 06/06/2013 0705   PLT 222 06/06/2013 0705   MCV 90.3 06/06/2013 0705   MCH 29.0 06/06/2013 0705   MCHC 32.1 06/06/2013 0705   RDW 17.4* 06/06/2013 0705   LYMPHSABS 1.6 02/14/2012 1220   MONOABS 0.5 02/14/2012 1220   EOSABS 0.2 02/14/2012 1220   BASOSABS 0.1 02/14/2012 1220     no clinical changes noted.  Tjuana Vickrey L. Harraway-Smith, M.D., [place]nn June

## 2013-06-06 NOTE — Transfer of Care (Signed)
Immediate Anesthesia Transfer of Care Note  Patient: Miranda Jackson  Procedure(s) Performed: Procedure(s): HYSTERECTOMY ABDOMINAL (N/A) BILATERAL SALPINGECTOMY (Bilateral)  Patient Location: PACU  Anesthesia Type:General  Level of Consciousness: awake, alert  and oriented  Airway & Oxygen Therapy: Patient Spontanous Breathing and Patient connected to nasal cannula oxygen  Post-op Assessment: Report given to PACU RN and Post -op Vital signs reviewed and stable  Post vital signs: Reviewed and stable  Complications: No apparent anesthesia complications

## 2013-06-06 NOTE — Anesthesia Preprocedure Evaluation (Addendum)
Anesthesia Evaluation  Patient identified by MRN, date of birth, ID band Patient awake    Reviewed: Allergy & Precautions, H&P , Patient's Chart, lab work & pertinent test results, reviewed documented beta blocker date and time   Airway Mallampati: II TM Distance: >3 FB Neck ROM: full    Dental no notable dental hx.    Pulmonary Current Smoker,  breath sounds clear to auscultation  Pulmonary exam normal       Cardiovascular Rhythm:regular Rate:Normal     Neuro/Psych    GI/Hepatic   Endo/Other    Renal/GU      Musculoskeletal   Abdominal   Peds  Hematology  (+) anemia ,   Anesthesia Other Findings   Reproductive/Obstetrics                          Anesthesia Physical Anesthesia Plan  ASA: II  Anesthesia Plan: General   Post-op Pain Management:    Induction: Intravenous  Airway Management Planned: Oral ETT  Additional Equipment:   Intra-op Plan:   Post-operative Plan: Extubation in OR  Informed Consent: I have reviewed the patients History and Physical, chart, labs and discussed the procedure including the risks, benefits and alternatives for the proposed anesthesia with the patient or authorized representative who has indicated his/her understanding and acceptance.   Dental Advisory Given and Dental advisory given  Plan Discussed with: CRNA and Surgeon  Anesthesia Plan Comments: (  Discussed general anesthesia, including possible nausea, instrumentation of airway, sore throat,pulmonary aspiration, etc. I asked if the were any outstanding questions, or  concerns before we proceeded. )        Anesthesia Quick Evaluation

## 2013-06-06 NOTE — Anesthesia Procedure Notes (Signed)
Procedure Name: Intubation Date/Time: 06/06/2013 10:35 AM Performed by: Flossie Dibble Pre-anesthesia Checklist: Suction available, Emergency Drugs available, Timeout performed, Patient being monitored and Patient identified Patient Re-evaluated:Patient Re-evaluated prior to inductionOxygen Delivery Method: Circle system utilized Preoxygenation: Pre-oxygenation with 100% oxygen Intubation Type: IV induction Ventilation: Mask ventilation without difficulty Laryngoscope Size: Mac and 3 Grade View: Grade I Tube type: Oral Tube size: 7.0 mm Number of attempts: 1 Airway Equipment and Method: Stylet Placement Confirmation: ETT inserted through vocal cords under direct vision,  breath sounds checked- equal and bilateral and positive ETCO2 Secured at: 20 cm Tube secured with: Tape Dental Injury: Teeth and Oropharynx as per pre-operative assessment

## 2013-06-06 NOTE — Progress Notes (Signed)
Arial from the lab called, critical lab value hbg 6.3.  Patient's RN Mingo Amber, RN in [facility] notified.  Dr Jillyn Hidden, Anesthesia notified. No orders given.

## 2013-06-07 ENCOUNTER — Encounter (HOSPITAL_COMMUNITY): Payer: Self-pay | Admitting: Obstetrics & Gynecology

## 2013-06-07 DIAGNOSIS — D259 Leiomyoma of uterus, unspecified: Secondary | ICD-10-CM

## 2013-06-07 DIAGNOSIS — D649 Anemia, unspecified: Secondary | ICD-10-CM

## 2013-06-07 LAB — CBC
HCT: 26.9 % — ABNORMAL LOW (ref 36.0–46.0)
Hemoglobin: 8.8 g/dL — ABNORMAL LOW (ref 12.0–15.0)
MCH: 28 pg (ref 26.0–34.0)
MCHC: 32.7 g/dL (ref 30.0–36.0)
MCV: 85.7 fL (ref 78.0–100.0)
PLATELETS: 189 10*3/uL (ref 150–400)
RBC: 3.14 MIL/uL — ABNORMAL LOW (ref 3.87–5.11)
RDW: 19.2 % — AB (ref 11.5–15.5)
WBC: 14.8 10*3/uL — AB (ref 4.0–10.5)

## 2013-06-07 LAB — TYPE AND SCREEN
ABO/RH(D): A POS
ANTIBODY SCREEN: NEGATIVE
UNIT DIVISION: 0
Unit division: 0

## 2013-06-07 MED ORDER — OXYCODONE-ACETAMINOPHEN 5-325 MG PO TABS: 1.0000 | ORAL_TABLET | Freq: Four times a day (QID) | ORAL | Status: DC | PRN

## 2013-06-07 MED ORDER — DSS 100 MG PO CAPS: 100.0000 mg | ORAL_CAPSULE | Freq: Two times a day (BID) | ORAL | Status: DC

## 2013-06-07 MED ORDER — IBUPROFEN 800 MG PO TABS: 800.0000 mg | ORAL_TABLET | Freq: Four times a day (QID) | ORAL | Status: DC

## 2013-06-07 NOTE — Discharge Instructions (Signed)
Hysterectomy °Care After °Refer to this sheet in the next few weeks. These instructions provide you with information on caring for yourself after your procedure. Your caregiver may also give you more specific instructions. Your treatment has been planned according to current medical practices, but problems sometimes occur. Call your caregiver if you have any problems or questions after your procedure. °HOME CARE INSTRUCTIONS  °Healing will take time. You may have discomfort, tenderness, swelling, and bruising at the surgical site for about 2 weeks. This is normal and will get better as time goes on. °· Only take over-the-counter or prescription medicines for pain, discomfort, or fever as directed by your caregiver. °· Do not take aspirin. It can cause bleeding. °· Do not drive when taking pain medicine. °· Follow your caregiver's advice regarding exercise, lifting, driving, and general activities. °· Resume your usual diet as directed and allowed. °· Get plenty of rest and sleep. °· Do not douche, use tampons, or have sexual intercourse for at least 6 weeks or until your caregiver gives you permission. °· Change your bandages (dressings) as directed by your caregiver. °· Monitor your temperature. °· Take showers instead of baths for 2 to 3 weeks. °· Do not drink alcohol until your caregiver gives you permission. °· If you are constipated, you may take a mild laxative with your caregiver's permission. Bran foods may help with constipation problems. Drinking enough fluids to keep your urine clear or pale yellow may help as well. °· Try to have someone home with you for 1 or 2 weeks to help around the house. °· Keep all of your follow-up appointments as directed by your caregiver. °SEEK MEDICAL CARE IF:  °· You have swelling, redness, or increasing pain in the surgical cut (incision) area. °· You have pus coming from the incision. °· You notice a bad smell coming from the incision or dressing. °· You have swelling,  redness, or pain around the intravenous (IV) site. °· Your incision breaks open. °· You feel dizzy or lightheaded. °· You have pain or bleeding when you urinate. °· You have persistent diarrhea. °· You have persistent nausea and vomiting. °· You have abnormal vaginal discharge. °· You have a rash. °· You have any type of abnormal reaction or develop an allergy to your medicine. °· Your pain is not controlled with your prescribed medicine. °SEEK IMMEDIATE MEDICAL CARE IF:  °· You have a fever. °· You have severe abdominal pain. °· You have chest pain. °· You have shortness of breath. °· You faint. °· You have pain, swelling, or redness of your leg. °· You have heavy vaginal bleeding with blood clots. °MAKE SURE YOU: °· Understand these instructions. °· Will watch your condition. °· Will get help right away if you are not doing well or get worse. °Document Released: 11/15/2004 Document Revised: 07/21/2011 Document Reviewed: 12/13/2010 °ExitCare® Patient Information ©2014 [employer]. ° °

## 2013-06-07 NOTE — Anesthesia Postprocedure Evaluation (Signed)
Anesthesia Post Note  Patient: Miranda Jackson  Procedure(s) Performed: Procedure(s) (LRB): HYSTERECTOMY ABDOMINAL (N/A) BILATERAL SALPINGECTOMY (Bilateral)  Anesthesia type: General  Patient location: Mother/Baby  Post pain: Pain level controlled  Post assessment: Post-op Vital signs reviewed  Last Vitals:  Filed Vitals:   06/07/13 0544  BP: 90/42  Pulse: 64  Temp: 36.4 C  Resp: 13    Post vital signs: Reviewed  Level of consciousness: awake and alert   Complications: No apparent anesthesia complications

## 2013-06-07 NOTE — Discharge Summary (Signed)
Physician Discharge Summary  Patient ID: Miranda Jackson MRN: [id number] DOB/AGE: 43-May-1972 45 y.o.  Admit date: 06/06/2013 Discharge date: 06/07/2013  Admission Diagnoses: large uterine fibroids; anemia  Discharge Diagnoses:  Active Problems:   Postoperative state   Discharged Condition: good  Hospital Course: Pt was admitted for TAH with bilateral salpingectomy.  She had no problems overnight. Her foley has been removed within the hour.  She has not yet voided.  She denies pain- she report mild cramping like a 'light period'  She tolerated a reg diet last night without n/v and has passed flatus.  She reports that she would like to go home today if possible.  No Bm as yet.  She was on a Morphine PCA last night but, per the nursing record she did not require any meds.  She was on Toradol.     Consults: None  Significant Diagnostic Studies: labs: CBC  Treatments: procedures: blood transfusion intraop and surgery: TAH with bilateral salpingectomy  Discharge Exam: Blood pressure 94/57, pulse 63, temperature 97.6 F (36.4 C), temperature source Oral, resp. rate 14, height 5\' 3"  (1.6 m), weight 126 lb (57.153 kg), last menstrual period 05/07/2013, SpO2 99.00%. General appearance: alert and no distress Resp: clear to auscultation bilaterally Cardio: regular rate and rhythm, S1, S2 normal, no murmur, click, rub or gallop GI: soft, non-tender; bowel sounds normal; no masses,  no organomegaly Extremities: extremities normal, atraumatic, no cyanosis or edema Incision/Wound:outer dressing removed.  Steristrips in place. Incision cleans and dry  CBC    Component Value Date/Time   WBC 14.8* 06/07/2013 0530   RBC 3.14* 06/07/2013 0530   RBC 3.26* 03/23/2013 1451   HGB 8.8* 06/07/2013 0530   HCT 26.9* 06/07/2013 0530   PLT 189 06/07/2013 0530   MCV 85.7 06/07/2013 0530   MCH 28.0 06/07/2013 0530   MCHC 32.7 06/07/2013 0530   RDW 19.2* 06/07/2013 0530   LYMPHSABS 1.6 02/14/2012 1220   MONOABS 0.5  02/14/2012 1220   EOSABS 0.2 02/14/2012 1220   BASOSABS 0.1 02/14/2012 1220     Disposition: 01-Home or Self Care  Discharge Orders   Future Appointments Provider Department Dept Phone   06/20/2013 12:45 PM Lavonia Drafts, MD [facility] [phone]   Future Orders Complete By Expires   Call MD for:  extreme fatigue  As directed    Call MD for:  persistant dizziness or light-headedness  As directed    Call MD for:  persistant nausea and vomiting  As directed    Call MD for:  redness, tenderness, or signs of infection (pain, swelling, redness, odor or green/yellow discharge around incision site)  As directed    Call MD for:  severe uncontrolled pain  As directed    Call MD for:  temperature >100.4  As directed    Diet - low sodium heart healthy  As directed    Driving Restrictions  As directed    Comments:     No driving for 2 weeks or while on pain medication   Increase activity slowly  As directed    Leave dressing on - Keep it clean, dry, and intact until clinic visit  As directed    Lifting restrictions  As directed    Comments:     No heavy lifting   Sexual Activity Restrictions  As directed    Comments:     Nothing in vagina for 6 weeks       Medication List    STOP taking these medications  OVER THE COUNTER MEDICATION      TAKE these medications       DSS 100 MG Caps  Take 100 mg by mouth 2 (two) times daily.     ferrous fumarate 325 (106 FE) MG Tabs tablet  Commonly known as:  HEMOCYTE - 106 mg FE  Take 1 tablet by mouth 3 (three) times daily.     ibuprofen 800 MG tablet  Commonly known as:  ADVIL,MOTRIN  Take 1 tablet (800 mg total) by mouth 4 (four) times daily.     multivitamin with minerals Tabs tablet  Take 1 tablet by mouth daily.     oxyCODONE-acetaminophen 5-325 MG per tablet  Commonly known as:  PERCOCET/ROXICET  Take 1-2 tablets by mouth every 6 (six) hours as needed.           Follow-up Information   Follow up  with Lavonia Drafts, MD In 2 weeks.   Specialty:  Obstetrics and Gynecology   Contact information:   [place] [id number] [phone]     Pt meets all discharge criteria and requests discharge.  Her discharge is pending tolerating a reg diet with no complications today and  Ability to void.   I reviewed with pt and her sister post op instructions.  SignedLavonia Drafts 06/07/2013, 10:41 AM

## 2013-06-07 NOTE — Progress Notes (Signed)
D/c teaching done  Pt out in wheelchair

## 2013-06-08 ENCOUNTER — Telehealth: Payer: Self-pay | Admitting: Obstetrics & Gynecology

## 2013-06-08 NOTE — Telephone Encounter (Signed)
T.C. To pt to check on her status post op.  She reports adequate pain control. She is voiding and passing gas without difficulty.  She reports no complaints.  I reviewed her pathology with her. She has a post op appt scheduled.  Brailyn Killion L. Harraway-Smith, M.D., Cherlynn June

## 2013-06-09 ENCOUNTER — Encounter: Payer: Self-pay | Admitting: *Deleted

## 2013-06-20 ENCOUNTER — Ambulatory Visit (INDEPENDENT_AMBULATORY_CARE_PROVIDER_SITE_OTHER): Payer: No Typology Code available for payment source | Admitting: Obstetrics & Gynecology

## 2013-06-20 ENCOUNTER — Encounter: Payer: Self-pay | Admitting: Obstetrics & Gynecology

## 2013-06-20 VITALS — BP 109/71 | HR 78 | Temp 98.2°F | Ht 63.0 in | Wt 126.4 lb

## 2013-06-20 DIAGNOSIS — Z09 Encounter for follow-up examination after completed treatment for conditions other than malignant neoplasm: Secondary | ICD-10-CM

## 2013-06-20 DIAGNOSIS — Z9889 Other specified postprocedural states: Secondary | ICD-10-CM

## 2013-06-20 NOTE — Progress Notes (Signed)
Subjective:     Patient ID: Miranda Jackson, female   DOB: [birth year], 43 y.o.   MRN: [id number]  HPI Pt presents for 2 week post op check.  She denies problems.  She is tolerating regular diet and is voiding and passing stools without difficulty.  She denies fever/chills.      Review of Systems     Objective:   Physical Exam BP 109/71  Pulse 78  Temp(Src) 98.2 F (36.8 C) (Oral)  Ht 5\' 3"  (1.6 m)  Wt 126 lb 6.4 oz (57.335 kg)  BMI 22.40 kg/m2  LMP 05/25/2013 Abd: soft; NT, ND +BS; incision clea/dry and intact.  Her steri-strips were removed today.       Assessment:     2 week post op check doing well     Plan:     F/u in 4 week or sooner prn Gradual return to full activities. No intercourse for 4 more weeks

## 2013-06-20 NOTE — Patient Instructions (Signed)
Hysterectomy °Care After °Refer to this sheet in the next few weeks. These instructions provide you with information on caring for yourself after your procedure. Your caregiver may also give you more specific instructions. Your treatment has been planned according to current medical practices, but problems sometimes occur. Call your caregiver if you have any problems or questions after your procedure. °HOME CARE INSTRUCTIONS  °Healing will take time. You may have discomfort, tenderness, swelling, and bruising at the surgical site for about 2 weeks. This is normal and will get better as time goes on. °· Only take over-the-counter or prescription medicines for pain, discomfort, or fever as directed by your caregiver. °· Do not take aspirin. It can cause bleeding. °· Do not drive when taking pain medicine. °· Follow your caregiver's advice regarding exercise, lifting, driving, and general activities. °· Resume your usual diet as directed and allowed. °· Get plenty of rest and sleep. °· Do not douche, use tampons, or have sexual intercourse for at least 6 weeks or until your caregiver gives you permission. °· Change your bandages (dressings) as directed by your caregiver. °· Monitor your temperature. °· Take showers instead of baths for 2 to 3 weeks. °· Do not drink alcohol until your caregiver gives you permission. °· If you are constipated, you may take a mild laxative with your caregiver's permission. Bran foods may help with constipation problems. Drinking enough fluids to keep your urine clear or pale yellow may help as well. °· Try to have someone home with you for 1 or 2 weeks to help around the house. °· Keep all of your follow-up appointments as directed by your caregiver. °SEEK MEDICAL CARE IF:  °· You have swelling, redness, or increasing pain in the surgical cut (incision) area. °· You have pus coming from the incision. °· You notice a bad smell coming from the incision or dressing. °· You have swelling,  redness, or pain around the intravenous (IV) site. °· Your incision breaks open. °· You feel dizzy or lightheaded. °· You have pain or bleeding when you urinate. °· You have persistent diarrhea. °· You have persistent nausea and vomiting. °· You have abnormal vaginal discharge. °· You have a rash. °· You have any type of abnormal reaction or develop an allergy to your medicine. °· Your pain is not controlled with your prescribed medicine. °SEEK IMMEDIATE MEDICAL CARE IF:  °· You have a fever. °· You have severe abdominal pain. °· You have chest pain. °· You have shortness of breath. °· You faint. °· You have pain, swelling, or redness of your leg. °· You have heavy vaginal bleeding with blood clots. °MAKE SURE YOU: °· Understand these instructions. °· Will watch your condition. °· Will get help right away if you are not doing well or get worse. °Document Released: 11/15/2004 Document Revised: 07/21/2011 Document Reviewed: 12/13/2010 °ExitCare® Patient Information ©2014 [employer]. ° °

## 2013-07-27 ENCOUNTER — Encounter: Payer: Self-pay | Admitting: Obstetrics & Gynecology

## 2013-07-27 ENCOUNTER — Ambulatory Visit (INDEPENDENT_AMBULATORY_CARE_PROVIDER_SITE_OTHER): Payer: No Typology Code available for payment source | Admitting: Obstetrics & Gynecology

## 2013-07-27 VITALS — BP 110/70 | HR 87 | Temp 97.4°F | Ht 60.0 in | Wt 127.9 lb

## 2013-07-27 DIAGNOSIS — Z09 Encounter for follow-up examination after completed treatment for conditions other than malignant neoplasm: Secondary | ICD-10-CM

## 2013-07-27 DIAGNOSIS — Z9889 Other specified postprocedural states: Secondary | ICD-10-CM

## 2013-07-27 NOTE — Progress Notes (Signed)
Subjective:     Patient ID: Miranda Jackson, female   DOB: 07-May-1971, 43 y.o.   MRN: [id number]  HPI Pt presents for 6 weeks f/u.  She denies problems. She has been sexually active. She denies pain.  Off all pain meds,     Review of Systems     Objective:   Physical Exam BP 110/70  Pulse 87  Temp(Src) 97.4 F (36.3 C) (Oral)  Ht 5' (1.524 m)  Wt 127 lb 14.4 oz (58.015 kg)  BMI 24.98 kg/m2  LMP 05/25/2013 Pt in NAD Abd: soft, NT, ND.  Incision clean, dry and intact 06/06/2013 Diagnosis Uterus and bilateral fallopian tubes, with cervix - BENIGN UTERINE LEIOMYOMATA (UP TO 13.5 CM). - PROLIFERATIVE PHASE ENDOMETRIUM; NEGATIVE FOR HYPERPLASIA OR MALIGNANCY. - BENIGN CERVICAL MUCOSA; NEGATIVE FOR INTRAEPITHELIAL LESION OR MALIGNANCY. - UNREMARKABLE UTERINE SEROSA. - BENIGN RIGHT AND LEFT FALLOPIAN TUBES; NEGATIVE FOR ATYPIA OR MALIGNANCY.     Assessment:     6 weeks post op check- doing well      Plan:     F/u in 3 months or sooner prn Return to full activities.

## 2014-03-13 ENCOUNTER — Encounter: Payer: Self-pay | Admitting: Obstetrics & Gynecology

## 2014-12-18 ENCOUNTER — Encounter (HOSPITAL_COMMUNITY): Payer: Self-pay | Admitting: *Deleted

## 2014-12-18 ENCOUNTER — Emergency Department (HOSPITAL_COMMUNITY)
Admission: EM | Admit: 2014-12-18 | Discharge: 2014-12-18 | Disposition: A | Discharge status: Home / Self Care | Payer: No Typology Code available for payment source | Attending: Emergency Medicine | Admitting: Emergency Medicine

## 2014-12-18 DIAGNOSIS — D649 Anemia, unspecified: Secondary | ICD-10-CM | POA: Insufficient documentation

## 2014-12-18 DIAGNOSIS — Z720 Tobacco use: Secondary | ICD-10-CM | POA: Insufficient documentation

## 2014-12-18 DIAGNOSIS — J029 Acute pharyngitis, unspecified: Secondary | ICD-10-CM | POA: Insufficient documentation

## 2014-12-18 DIAGNOSIS — Z79899 Other long term (current) drug therapy: Secondary | ICD-10-CM | POA: Insufficient documentation

## 2014-12-18 DIAGNOSIS — Z86018 Personal history of other benign neoplasm: Secondary | ICD-10-CM | POA: Insufficient documentation

## 2014-12-18 LAB — RAPID STREP SCREEN (MED CTR MEBANE ONLY): STREPTOCOCCUS, GROUP A SCREEN (DIRECT): NEGATIVE

## 2014-12-18 NOTE — Discharge Instructions (Signed)
Read the information below.  You may return to the Emergency Department at any time for worsening condition or any new symptoms that concern you.  If you develop high fevers, difficulty swallowing or breathing, or you are unable to tolerate fluids by mouth, return to the ER immediately for a recheck.      Pharyngitis Pharyngitis is a sore throat (pharynx). There is redness, pain, and swelling of your throat. HOME CARE   Drink enough fluids to keep your pee (urine) clear or pale yellow.  Only take medicine as told by your doctor.  You may get sick again if you do not take medicine as told. Finish your medicines, even if you start to feel better.  Do not take aspirin.  Rest.  Rinse your mouth (gargle) with salt water ( tsp of salt per 1 qt of water) every 1-2 hours. This will help the pain.  If you are not at risk for choking, you can suck on hard candy or sore throat lozenges. GET HELP IF:  You have large, tender lumps on your neck.  You have a rash.  You cough up green, yellow-brown, or bloody spit. GET HELP RIGHT AWAY IF:   You have a stiff neck.  You drool or cannot swallow liquids.  You throw up (vomit) or are not able to keep medicine or liquids down.  You have very bad pain that does not go away with medicine.  You have problems breathing (not from a stuffy nose). MAKE SURE YOU:   Understand these instructions.  Will watch your condition.  Will get help right away if you are not doing well or get worse. Document Released: 10/15/2007 Document Revised: 02/16/2013 Document Reviewed: 01/03/2013 [facility] Patient Information 2015 [place], [place]. This information is not intended to replace advice given to you by your health care provider. Make sure you discuss any questions you have with your health care provider.

## 2014-12-18 NOTE — ED Notes (Signed)
Declined W/C at D/C and was escorted to lobby by RN. 

## 2014-12-18 NOTE — ED Provider Notes (Signed)
CSN: [id number]     Arrival date & time 12/18/14  1007 History   First MD Initiated Contact with Patient 12/18/14 1119     Chief Complaint  Patient presents with  . Sore Throat     (Consider location/radiation/quality/duration/timing/severity/associated sxs/prior Treatment) The history is provided by the patient.     Pt with sore throat x 6 days.  Pain is worse with swallowing.  Also having cough productive of clear sputum.  Taking ibuprofen with great relief.  Denies fevers, chills,myalgias, nasal symptoms, SOB, CP.  No known sick contacts.    Past Medical History  Diagnosis Date  . Fibroids   . Anemia   . SVD (spontaneous vaginal delivery)     x 3   Past Surgical History  Procedure Laterality Date  . Tubal ligation  1993  . Cervical disc surgery  2012     in [place]  . Wisdom tooth extraction  2004     in [place]  . Abdominal hysterectomy N/A 06/06/2013    Procedure: HYSTERECTOMY ABDOMINAL;  Surgeon: Lavonia Drafts, MD;  Location: [facility];  Service: Gynecology;  Laterality: N/A;  . Bilateral salpingectomy Bilateral 06/06/2013    Procedure: BILATERAL SALPINGECTOMY;  Surgeon: Lavonia Drafts, MD;  Location: [facility];  Service: Gynecology;  Laterality: Bilateral;   Family History  Problem Relation Age of Onset  . Hypertension Maternal Grandfather   . Heart disease Mother     heart attack   History  Substance Use Topics  . Smoking status: Current Every Day Smoker -- 0.50 packs/day for 21 years    Types: Cigarettes  . Smokeless tobacco: Never Used  . Alcohol Use: No     Comment: daily six pack-former   OB History    Gravida Para Term Preterm AB TAB SAB Ectopic Multiple Living   3 3 3       3      Review of Systems  Constitutional: Negative for fever and chills.  HENT: Positive for sore throat. Negative for congestion, dental problem, ear pain, mouth sores, rhinorrhea and trouble swallowing.   Eyes: Negative for discharge.  Respiratory: Negative for cough,  shortness of breath, wheezing and stridor.   Cardiovascular: Negative for chest pain.  Musculoskeletal: Negative for neck pain and neck stiffness.      Allergies  Dilaudid  Home Medications   Prior to Admission medications   Medication Sig Start Date End Date Taking? Authorizing Provider  docusate sodium 100 MG CAPS Take 100 mg by mouth 2 (two) times daily. 06/07/13   Lavonia Drafts, MD  ferrous fumarate (HEMOCYTE - 106 MG FE) 325 (106 FE) MG TABS tablet Take 1 tablet by mouth 3 (three) times daily.    Historical Provider, MD  ibuprofen (ADVIL,MOTRIN) 800 MG tablet Take 1 tablet (800 mg total) by mouth 4 (four) times daily. 06/07/13   Lavonia Drafts, MD  Multiple Vitamin (MULTIVITAMIN WITH MINERALS) TABS tablet Take 1 tablet by mouth daily.    Historical Provider, MD  oxyCODONE-acetaminophen (PERCOCET/ROXICET) 5-325 MG per tablet Take 1-2 tablets by mouth every 6 (six) hours as needed. 06/07/13   Lavonia Drafts, MD   BP 133/78 mmHg  Pulse 58  Temp(Src) 98.2 F (36.8 C) (Oral)  Resp 18  SpO2 99%  LMP 05/25/2013 Physical Exam  Constitutional: She appears well-developed and well-nourished. No distress.  HENT:  Head: Normocephalic and atraumatic.  Mouth/Throat: Mucous membranes are not dry. Posterior oropharyngeal edema and posterior oropharyngeal erythema present. No oropharyngeal exudate.  Eyes: Conjunctivae are normal.  Neck: Neck supple.  Cardiovascular: Normal rate and regular rhythm.   Pulmonary/Chest: Effort normal and breath sounds normal. No stridor. No respiratory distress. She has no wheezes. She has no rales.  Lymphadenopathy:    She has cervical adenopathy.  Neurological: She is alert.  Skin: She is not diaphoretic.  Psychiatric: She has a normal mood and affect. Her behavior is normal.  Nursing note and vitals reviewed.   ED Course  Procedures (including critical care time) Labs Review Labs Reviewed  RAPID STREP SCREEN (NOT AT [facility])   CULTURE, GROUP A STREP    Imaging Review No results found.   EKG Interpretation None      MDM   Final diagnoses:  Pharyngitis    Afebrile, nontoxic patient with constellation of symptoms suggestive of viral syndrome.  No concerning findings on exam.  Strep screen negative.  Discharged home with supportive care, PCP follow up.  Discussed result, findings, treatment, and follow up  with patient.  Pt given return precautions.  Pt verbalizes understanding and agrees with plan.        Clayton Bibles, PA-C 12/18/14 [place], MD 12/18/14 [phone]

## 2014-12-18 NOTE — ED Notes (Signed)
Pt is here pain in her throat when she swallows.  She does not seem to have redness or irritation to back of throat but says it is more when she swallows.  Pt states motrin helps the pain

## 2014-12-20 LAB — CULTURE, GROUP A STREP: STREP A CULTURE: NEGATIVE

## 2016-11-29 ENCOUNTER — Emergency Department (HOSPITAL_COMMUNITY)
Admission: EM | Admit: 2016-11-29 | Discharge: 2016-11-29 | Disposition: A | Discharge status: Home / Self Care | Payer: No Typology Code available for payment source | Attending: Emergency Medicine | Admitting: Emergency Medicine

## 2016-11-29 ENCOUNTER — Encounter (HOSPITAL_COMMUNITY): Payer: Self-pay

## 2016-11-29 DIAGNOSIS — K029 Dental caries, unspecified: Secondary | ICD-10-CM | POA: Insufficient documentation

## 2016-11-29 DIAGNOSIS — F17210 Nicotine dependence, cigarettes, uncomplicated: Secondary | ICD-10-CM | POA: Insufficient documentation

## 2016-11-29 DIAGNOSIS — Z79899 Other long term (current) drug therapy: Secondary | ICD-10-CM | POA: Insufficient documentation

## 2016-11-29 MED ORDER — IBUPROFEN 600 MG PO TABS: 600.0000 mg | ORAL_TABLET | Freq: Four times a day (QID) | ORAL | 0 refills | Status: DC | PRN

## 2016-11-29 MED ORDER — PENICILLIN V POTASSIUM 500 MG PO TABS
500.0000 mg | ORAL_TABLET | Freq: Three times a day (TID) | ORAL | 0 refills | Status: DC
Start: 2016-11-29 — End: 2017-07-31

## 2016-11-29 NOTE — ED Provider Notes (Signed)
[facility] DEPT Provider Note   CSN: [id number] Arrival date & time: 11/29/16  1344     History   Chief Complaint No chief complaint on file.   HPI Miranda Jackson is a 46 y.o. female.  HPI   46 year old female presenting complaining of dental pain. Patient report for the past 5 days she has had progressive worsening dental pain. Pain is involved her right lower tooth. She described pain as a sharp sensation with shooting pain worse with chewing. She states pain is currently 8 out of 10. She denies any associated fever, chills, trouble swallowing but states she's unable to eat due to the pain. She has been using Orajel, and Advil around the clock with some improvement. She has not been seen by dentist for this yet.  Past Medical History:  Diagnosis Date  . Anemia   . Fibroids   . SVD (spontaneous vaginal delivery)    x 3    Patient Active Problem List   Diagnosis Date Noted  . Postoperative state 06/06/2013  . Anemia 03/23/2013  . Leiomyoma of uterus, unspecified 03/15/2012    Past Surgical History:  Procedure Laterality Date  . ABDOMINAL HYSTERECTOMY N/A 06/06/2013   Procedure: HYSTERECTOMY ABDOMINAL;  Surgeon: Lavonia Drafts, MD;  Location: [facility];  Service: Gynecology;  Laterality: N/A;  . BILATERAL SALPINGECTOMY Bilateral 06/06/2013   Procedure: BILATERAL SALPINGECTOMY;  Surgeon: Lavonia Drafts, MD;  Location: [facility];  Service: Gynecology;  Laterality: Bilateral;  . [place] SURGERY  2012    in [place]  . TUBAL LIGATION  1993  . WISDOM TOOTH EXTRACTION  2004    in [place]    OB History    Gravida Para Term Preterm AB Living   3 3 3     3    SAB TAB Ectopic Multiple Live Births           3       Home Medications    Prior to Admission medications   Medication Sig Start Date End Date Taking? Authorizing Provider  docusate sodium 100 MG CAPS Take 100 mg by mouth 2 (two) times daily. 06/07/13   Lavonia Drafts, MD  ferrous  fumarate (HEMOCYTE - 106 MG FE) 325 (106 FE) MG TABS tablet Take 1 tablet by mouth 3 (three) times daily.    [provider]  ibuprofen (ADVIL,MOTRIN) 800 MG tablet Take 1 tablet (800 mg total) by mouth 4 (four) times daily. 06/07/13   Lavonia Drafts, MD  Multiple Vitamin (MULTIVITAMIN WITH MINERALS) TABS tablet Take 1 tablet by mouth daily.    [provider]  oxyCODONE-acetaminophen (PERCOCET/ROXICET) 5-325 MG per tablet Take 1-2 tablets by mouth every 6 (six) hours as needed. 06/07/13   Lavonia Drafts, MD    Family History Family History  Problem Relation Age of Onset  . Hypertension Maternal Grandfather   . Heart disease Mother        heart attack    Social History Social History  Substance Use Topics  . Smoking status: Current Every Day Smoker    Packs/day: 0.50    Years: 21.00    Types: Cigarettes  . Smokeless tobacco: Never Used  . Alcohol use No     Comment: daily six pack-former     Allergies   Dilaudid [hydromorphone hcl]   Review of Systems Review of Systems  Constitutional: Negative for fever.  HENT: Positive for dental problem.   Neurological: Negative for headaches.     Physical Exam Updated Vital Signs BP 127/81  Pulse 70   Temp 98.5 F (36.9 C) (Oral)   Resp 16   LMP 05/25/2013   SpO2 100%   Physical Exam  Constitutional: She appears well-developed and well-nourished. No distress.  HENT:  Head: Atraumatic.  Mouth: Dental decay noted to tooth #29 with exquisite tenderness to palpation but no gingival erythema and no abscess amenable for drainage. No trismus. No facial swelling  Eyes: Conjunctivae are normal.  Neck: Neck supple.  Lymphadenopathy:    She has no cervical adenopathy.  Neurological: She is alert.  Skin: No rash noted.  Psychiatric: She has a normal mood and affect.  Nursing note and vitals reviewed.    ED Treatments / Results  Labs (all labs ordered are listed, but only abnormal results  are displayed) Labs Reviewed - No data to display  EKG  EKG Interpretation None       Radiology No results found.  Procedures Procedures (including critical care time)  Medications Ordered in ED Medications - No data to display   Initial Impression / Assessment and Plan / ED Course  I have reviewed the triage vital signs and the nursing notes.  Pertinent labs & imaging results that were available during my care of the patient were reviewed by me and considered in my medical decision making (see chart for details).     BP 127/81   Pulse 70   Temp 98.5 F (36.9 C) (Oral)   Resp 16   LMP 05/25/2013   SpO2 100%    Final Clinical Impressions(s) / ED Diagnoses   Final diagnoses:  Pain due to dental caries    New Prescriptions New Prescriptions   IBUPROFEN (ADVIL,MOTRIN) 600 MG TABLET    Take 1 tablet (600 mg total) by mouth every 6 (six) hours as needed.   PENICILLIN V POTASSIUM (VEETID) 500 MG TABLET    Take 1 tablet (500 mg total) by mouth 3 (three) times daily.   Patient with dentalgia.  No abscess requiring immediate incision and drainage.  Exam not concerning for Ludwig's angina or pharyngeal abscess.  Will treat with NSAIDS, PCN. Pt instructed to follow-up with dentist.  Discussed return precautions. Pt safe for discharge.    Domenic Moras, PA-C 11/29/16 1522    Charlesetta Shanks, MD 11/29/16 [phone]

## 2016-11-29 NOTE — ED Triage Notes (Signed)
Patient here with right lower dental pain for several days. Has tried otc med with no relief, unable to eat due to discomfort

## 2016-11-29 NOTE — ED Notes (Signed)
C/o right lower dental pain x 4 days.

## 2016-11-29 NOTE — ED Notes (Signed)
Declined W/C at D/C and was escorted to lobby by RN. 

## 2017-07-22 ENCOUNTER — Ambulatory Visit: Payer: No Typology Code available for payment source | Admitting: Family Medicine

## 2017-07-31 ENCOUNTER — Ambulatory Visit (INDEPENDENT_AMBULATORY_CARE_PROVIDER_SITE_OTHER): Payer: Self-pay | Admitting: Family Medicine

## 2017-07-31 ENCOUNTER — Encounter: Payer: Self-pay | Admitting: Family Medicine

## 2017-07-31 VITALS — BP 131/81 | HR 56 | Temp 97.9°F | Resp 16 | Ht 63.0 in | Wt 151.0 lb

## 2017-07-31 DIAGNOSIS — Z23 Encounter for immunization: Secondary | ICD-10-CM

## 2017-07-31 DIAGNOSIS — R519 Headache, unspecified: Secondary | ICD-10-CM

## 2017-07-31 DIAGNOSIS — Z8619 Personal history of other infectious and parasitic diseases: Secondary | ICD-10-CM

## 2017-07-31 DIAGNOSIS — Z789 Other specified health status: Secondary | ICD-10-CM

## 2017-07-31 DIAGNOSIS — Z131 Encounter for screening for diabetes mellitus: Secondary | ICD-10-CM

## 2017-07-31 DIAGNOSIS — F17210 Nicotine dependence, cigarettes, uncomplicated: Secondary | ICD-10-CM

## 2017-07-31 DIAGNOSIS — K0889 Other specified disorders of teeth and supporting structures: Secondary | ICD-10-CM

## 2017-07-31 DIAGNOSIS — Z7289 Other problems related to lifestyle: Secondary | ICD-10-CM

## 2017-07-31 DIAGNOSIS — D649 Anemia, unspecified: Secondary | ICD-10-CM

## 2017-07-31 DIAGNOSIS — R51 Headache: Secondary | ICD-10-CM

## 2017-07-31 LAB — POCT URINALYSIS DIP (DEVICE)
Bilirubin Urine: NEGATIVE
GLUCOSE, UA: NEGATIVE mg/dL
Hgb urine dipstick: NEGATIVE
KETONES UR: NEGATIVE mg/dL
Leukocytes, UA: NEGATIVE
Nitrite: NEGATIVE
PH: 7.5 (ref 5.0–8.0)
Protein, ur: NEGATIVE mg/dL
Specific Gravity, Urine: 1.01 (ref 1.005–1.030)
Urobilinogen, UA: 0.2 mg/dL (ref 0.0–1.0)

## 2017-07-31 LAB — POCT GLYCOSYLATED HEMOGLOBIN (HGB A1C): Hemoglobin A1C: 5.1

## 2017-07-31 MED ORDER — KETOROLAC TROMETHAMINE 60 MG/2ML IM SOLN
60.0000 mg | Freq: Once | INTRAMUSCULAR | Status: AC
  Administered 2017-07-31: 60 mg via INTRAMUSCULAR

## 2017-07-31 MED ORDER — AMOXICILLIN 500 MG PO CAPS: 500.0000 mg | ORAL_CAPSULE | Freq: Three times a day (TID) | ORAL | 0 refills | Status: AC

## 2017-07-31 MED ORDER — IBUPROFEN 800 MG PO TABS: 800.0000 mg | ORAL_TABLET | Freq: Three times a day (TID) | ORAL | 1 refills | Status: DC | PRN

## 2017-07-31 NOTE — Patient Instructions (Addendum)
Miranda Jackson, to schedule screening mammogram to Breast Cancer Cervical Cancer (BCCCP) program. [phone].    [facility] [address] [place]   For left dental pain and possible abscess, will start Amoxicillin 500 mg three per day for 7 days. Complete financial assistance forms and will send a referral to dentist when payer source becomes available.   Ibuprofen 800 mg every 8 hours as needed for mild to moderate pain with food.      The patient was given clear instructions to go to ER or return to medical center if symptoms do not improve, worsen or new problems develop. The patient verbalized understanding.     I will follow up by phone with lab results

## 2017-07-31 NOTE — Progress Notes (Signed)
Subjective:    Patient ID: Miranda Jackson, female    DOB: May 01, 1971, 47 y.o.   MRN: [id number]  HPI  A 47 year old female with a history of dental pain, anemia, alcohol and tobacco abuse presents to establish care. Patient says that she has mostly been using the emergency department for all primary needs.   Miranda Jackson is complaining of left dental pain over the past several weeks. Pain is primarily to left face.  She mostly has pain with eating hot or cold items and laying down. She says that it has been difficult to chew due to swelling. Current pain intensity is 8/10 characterized as intermittent and throbbing. "It feels like it has its own heartbeat".   Patient is a chronic every day smoker, she typically smokes 3-4 cigarettes per day. She also says that she drinks beer daily, which has increased over the past several months. She attributes current alcohol use to increased stressors. She says that she does not allow her family to see her drinking alcohol. She often feels guilty about drinking alcohol. However, "I like to drink beer".  Patient is complaining of recurrent cold sores. She has not been diagnosed with HSV1 or HSV 2. She has not attempted any OTC interventions to alleviate symptoms.  Past Medical History:  Diagnosis Date  . Alcohol abuse   . Anemia   . Fibroids   . SVD (spontaneous vaginal delivery)    x 3   Social History   Socioeconomic History  . Marital status: Married    Spouse name: Not on file  . Number of children: Not on file  . Years of education: Not on file  . Highest education level: Not on file  Occupational History  . Not on file  Social Needs  . Financial resource strain: Not on file  . Food insecurity:    Worry: Not on file    Inability: Not on file  . Transportation needs:    Medical: Not on file    Non-medical: Not on file  Tobacco Use  . Smoking status: Current Every Day Smoker    Packs/day: 0.50    Years: 21.00    Pack years: 10.50    Types: Cigarettes  . Smokeless tobacco: Never Used  Substance and Sexual Activity  . Alcohol use: No    Alcohol/week: 25.2 oz    Types: 42 Cans of beer per week    Comment: daily six pack-former  . Drug use: No  . Sexual activity: Yes    Birth control/protection: Surgical  Lifestyle  . Physical activity:    Days per week: Not on file    Minutes per session: Not on file  . Stress: Not on file  Relationships  . Social connections:    Talks on phone: Not on file    Gets together: Not on file    Attends religious service: Not on file    Active member of club or organization: Not on file    Attends meetings of clubs or organizations: Not on file    Relationship status: Not on file  . Intimate partner violence:    Fear of current or ex partner: Not on file    Emotionally abused: Not on file    Physically abused: Not on file    Forced sexual activity: Not on file  Other Topics Concern  . Not on file  Social History Narrative  . Not on file    Review of Systems  Constitutional: Negative.  HENT: Negative.   Eyes: Negative.   Respiratory: Negative.   Cardiovascular: Negative.   Gastrointestinal: Negative.   Endocrine: Negative for polydipsia, polyphagia and polyuria.  Genitourinary: Negative.   Musculoskeletal: Negative.   Skin:       Recurrent cold sores to lips  Hematological: Negative.   Psychiatric/Behavioral: Negative.        Objective:   Physical Exam  Constitutional: She is oriented to person, place, and time. She appears well-developed.  HENT:  Head: Normocephalic.  Right Ear: External ear normal.  Left Ear: External ear normal.  Mouth/Throat: Oropharynx is clear and moist. Abnormal dentition. Dental caries present.  Upper gum erythema and swelling.   Eyes: Pupils are equal, round, and reactive to light.  Neck: Normal range of motion.  Cardiovascular: Normal rate, regular rhythm, normal heart sounds and intact distal pulses.  Pulmonary/Chest: Effort  normal and breath sounds normal.  Abdominal: Soft. Bowel sounds are normal.  Neurological: She is alert and oriented to person, place, and time. She has normal reflexes.  Skin: Skin is warm and dry.  Psychiatric: She has a normal mood and affect. Her behavior is normal. Judgment and thought content normal.     BP 131/81 (BP Location: Right Arm, Patient Position: Sitting, Cuff Size: Normal)   Pulse (!) 56   Temp 97.9 F (36.6 C) (Oral)   Resp 16   Ht 5\' 3"  (1.6 m)   Wt 151 lb (68.5 kg)   LMP 05/25/2013   SpO2 100%   BMI 26.75 kg/m  Assessment & Plan:  1. Pain, dental Will start a course of antibiotics for a possible dental abscess. Patient warrants a dental referral for further work up and evaluation. She does not have a payer source and was advised to complete forms for financial assistance.  - amoxicillin (AMOXIL) 500 MG capsule; Take 1 capsule (500 mg total) by mouth 3 (three) times daily for 7 days.  Dispense: 21 capsule; Refill: 0 - ibuprofen (ADVIL,MOTRIN) 800 MG tablet; Take 1 tablet (800 mg total) by mouth every 8 (eight) hours as needed.  Dispense: 30 tablet; Refill: 1 - ketorolac (TORADOL) injection 60 mg  2. Left facial pain - amoxicillin (AMOXIL) 500 MG capsule; Take 1 capsule (500 mg total) by mouth 3 (three) times daily for 7 days.  Dispense: 21 capsule; Refill: 0 - ibuprofen (ADVIL,MOTRIN) 800 MG tablet; Take 1 tablet (800 mg total) by mouth every 8 (eight) hours as needed.  Dispense: 30 tablet; Refill: 1 - ketorolac (TORADOL) injection 60 mg  3. Diabetes mellitus screening - HgB A1c - POCT urinalysis dip (device)  4. Anemia, unspecified type - CBC  5. Need for Tdap vaccination - Tdap vaccine greater than or equal to 7yo IM  6. 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 Tobacco dependence Smoking cessation instruction/counseling given:  counseled patient on the dangers of tobacco use, advised patient to stop smoking, and reviewed strategies  to maximize success   8. Alcohol use Discussed daily alcohol use at length. Agreed to call [facility] for assistance. Discussed the danger of alcohol use.   [facility]    RTC: 6 months for CPE   Donia Pounds  MSN, FNP-C [facility] [address] [place], [place] [address] [phone]

## 2017-08-01 LAB — CBC
HEMOGLOBIN: 12.7 g/dL (ref 11.1–15.9)
Hematocrit: 38.8 % (ref 34.0–46.6)
MCH: 32.1 pg (ref 26.6–33.0)
MCHC: 32.7 g/dL (ref 31.5–35.7)
MCV: 98 fL — AB (ref 79–97)
PLATELETS: 255 10*3/uL (ref 150–379)
RBC: 3.96 x10E6/uL (ref 3.77–5.28)
RDW: 13.5 % (ref 12.3–15.4)
WBC: 6.9 10*3/uL (ref 3.4–10.8)

## 2017-08-01 LAB — HSV(HERPES SIMPLEX VRS) I + II AB-IGG
HSV 1 Glycoprotein G Ab, IgG: 47.9 index — ABNORMAL HIGH (ref 0.00–0.90)
HSV 2 IgG, Type Spec: 10.5 index — ABNORMAL HIGH (ref 0.00–0.90)

## 2017-08-04 ENCOUNTER — Encounter: Payer: Self-pay | Admitting: Family Medicine

## 2017-08-04 ENCOUNTER — Telehealth: Payer: Self-pay

## 2017-08-04 NOTE — Telephone Encounter (Signed)
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 Advised that we will treat with suppression therapy with valtrex during outbreaks and to follow up as needed. Thanks!

## 2017-08-04 NOTE — Telephone Encounter (Signed)
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  Recommend suppression therapy with Valtrex during outbreaks. Follow up in office as needed.   Donia Pounds  MSN, FNP-C [facility] [address] [place], [place] [address] [phone]

## 2017-08-20 ENCOUNTER — Ambulatory Visit (HOSPITAL_COMMUNITY)
Admission: EM | Admit: 2017-08-20 | Discharge: 2017-08-20 | Disposition: A | Discharge status: Home / Self Care | Payer: No Typology Code available for payment source | Attending: Internal Medicine | Admitting: Internal Medicine

## 2017-08-20 ENCOUNTER — Other Ambulatory Visit: Payer: Self-pay

## 2017-08-20 ENCOUNTER — Encounter (HOSPITAL_COMMUNITY): Payer: Self-pay | Admitting: Emergency Medicine

## 2017-08-20 DIAGNOSIS — R51 Headache: Secondary | ICD-10-CM

## 2017-08-20 DIAGNOSIS — K0889 Other specified disorders of teeth and supporting structures: Secondary | ICD-10-CM

## 2017-08-20 DIAGNOSIS — R519 Headache, unspecified: Secondary | ICD-10-CM

## 2017-08-20 MED ORDER — PENICILLIN V POTASSIUM 500 MG PO TABS: 500.0000 mg | ORAL_TABLET | Freq: Four times a day (QID) | ORAL | 0 refills | Status: AC

## 2017-08-20 MED ORDER — KETOROLAC TROMETHAMINE 60 MG/2ML IM SOLN
60.0000 mg | Freq: Once | INTRAMUSCULAR | Status: AC
  Administered 2017-08-20: 60 mg via INTRAMUSCULAR

## 2017-08-20 MED ORDER — IBUPROFEN 800 MG PO TABS: 800.0000 mg | ORAL_TABLET | Freq: Three times a day (TID) | ORAL | 0 refills | Status: DC | PRN

## 2017-08-20 MED ORDER — KETOROLAC TROMETHAMINE 60 MG/2ML IM SOLN
INTRAMUSCULAR | Status: AC
  Filled 2017-08-20: qty 2

## 2017-08-20 NOTE — ED Provider Notes (Signed)
[facility]    CSN: [id number] Arrival date & time: 08/20/17  1211     History   Chief Complaint Chief Complaint  Patient presents with  . Oral Swelling    HPI Miranda Jackson is a 47 y.o. female.   47 year old female comes in for 1 day history of facial swelling and dental pain. Patient states she was seen recently by her PCP for similar symptoms, she was given amoxicillin, which helped with the symptom. Pain and swelling returned this morning. States swelling has since resolved. She has known dental caries. States has not been able to see a dentist due to finances. Denies fever, chills, night sweats. Denies trouble breathing, trouble swallowing, swelling of the throat.      Past Medical History:  Diagnosis Date  . Alcohol abuse   . Anemia   . Fibroids   . SVD (spontaneous vaginal delivery)    x 3  . Tobacco dependence     Patient Active Problem List   Diagnosis Date Noted  . Postoperative state 06/06/2013  . Anemia 03/23/2013  . Leiomyoma of uterus, unspecified 03/15/2012    Past Surgical History:  Procedure Laterality Date  . ABDOMINAL HYSTERECTOMY N/A 06/06/2013   Procedure: HYSTERECTOMY ABDOMINAL;  Surgeon: Lavonia Drafts, MD;  Location: [facility];  Service: Gynecology;  Laterality: N/A;  . BILATERAL SALPINGECTOMY Bilateral 06/06/2013   Procedure: BILATERAL SALPINGECTOMY;  Surgeon: Lavonia Drafts, MD;  Location: [facility];  Service: Gynecology;  Laterality: Bilateral;  . [place] SURGERY  2012    in [place]  . TUBAL LIGATION  1993  . WISDOM TOOTH EXTRACTION  2004    in [place]    OB History    Gravida  3   Para  3   Term  3   Preterm      AB      Living  3     SAB      TAB      Ectopic      Multiple      Live Births  3            Home Medications    Prior to Admission medications   Medication Sig Start Date End Date Taking? Authorizing Provider  ibuprofen (ADVIL,MOTRIN) 800 MG tablet Take 1 tablet (800  mg total) by mouth every 8 (eight) hours as needed. 08/20/17   Tasia Catchings, Amy V, PA-C  penicillin v potassium (VEETID) 500 MG tablet Take 1 tablet (500 mg total) by mouth 4 (four) times daily for 7 days. 08/20/17 08/27/17  Ok Edwards, PA-C    Family History Family History  Problem Relation Age of Onset  . Hypertension Maternal Grandfather   . Heart disease Mother        heart attack    Social History Social History   Tobacco Use  . Smoking status: Current Every Day Smoker    Packs/day: 0.50    Years: 21.00    Pack years: 10.50    Types: Cigarettes  . Smokeless tobacco: Never Used  Substance Use Topics  . Alcohol use: No    Alcohol/week: 25.2 oz    Types: 42 Cans of beer per week    Comment: daily six pack-former  . Drug use: No     Allergies   Dilaudid [hydromorphone hcl]   Review of Systems Review of Systems  Reason unable to perform ROS: See HPI as above.     Physical Exam Triage Vital Signs ED Triage  Vitals  Enc Vitals Group     BP 08/20/17 1322 126/74     Pulse Rate 08/20/17 1322 69     Resp --      Temp 08/20/17 1322 98.3 F (36.8 C)     Temp Source 08/20/17 1322 Oral     SpO2 08/20/17 1322 100 %     Weight --      Height --      Head Circumference --      Peak Flow --      Pain Score 08/20/17 1319 7     Pain Loc --      Pain Edu? --      Excl. in [place]? --    No data found.  Updated Vital Signs BP 126/74 (BP Location: Left Arm)   Pulse 69   Temp 98.3 F (36.8 C) (Oral)   LMP 05/25/2013   SpO2 100%   Physical Exam  Constitutional: She is oriented to person, place, and time. She appears well-developed and well-nourished. No distress.  HENT:  Head: Normocephalic and atraumatic.  Nose: Nose normal. Right sinus exhibits no maxillary sinus tenderness and no frontal sinus tenderness. Left sinus exhibits no maxillary sinus tenderness and no frontal sinus tenderness.  Mouth/Throat: Uvula is midline, oropharynx is clear and moist and mucous membranes are  normal. Abnormal dentition. Dental caries present. No tonsillar exudate.  Overall poor dentition. Gum tenderness along left upper jaw.   Floor of mouth soft to palpation. No facial swelling noted.   Eyes: Pupils are equal, round, and reactive to light. Conjunctivae are normal.  Neurological: She is alert and oriented to person, place, and time.     UC Treatments / Results  Labs (all labs ordered are listed, but only abnormal results are displayed) Labs Reviewed - No data to display  EKG None Radiology No results found.  Procedures Procedures (including critical care time)  Medications Ordered in UC Medications  ketorolac (TORADOL) injection 60 mg (60 mg Intramuscular Given 08/20/17 1435)     Initial Impression / Assessment and Plan / UC Course  I have reviewed the triage vital signs and the nursing notes.  Pertinent labs & imaging results that were available during my care of the patient were reviewed by me and considered in my medical decision making (see chart for details).    Start antibiotics for possible dental abscess. Symptomatic treatment as needed. Discussed with patient symptoms can return if dental problem is not addressed. Follow up with dentist for further evaluation and treatment of dental pain. Resources given. Return precautions given.    Final Clinical Impressions(s) / UC Diagnoses   Final diagnoses:  Pain, dental    ED Discharge Orders        Ordered    penicillin v potassium (VEETID) 500 MG tablet  4 times daily     08/20/17 1422    ibuprofen (ADVIL,MOTRIN) 800 MG tablet  Every 8 hours PRN     08/20/17 1422       Ok Edwards, PA-C 08/20/17 1603

## 2017-08-20 NOTE — ED Triage Notes (Signed)
States woke up this AM with facial and tooth swelling with pain

## 2017-08-20 NOTE — Discharge Instructions (Addendum)
Start Penicillin as directed for dental abscess. Toradol injection in office today. Ibuprofen as directed for pain. Follow up with dentist for further treatment and evaluation. If experiencing swelling of the throat, trouble breathing, trouble swallowing, leaning forward to breath, drooling, go to the emergency department for further evaluation.

## 2018-02-01 ENCOUNTER — Ambulatory Visit: Payer: Self-pay | Admitting: Family Medicine

## 2018-07-25 ENCOUNTER — Emergency Department (HOSPITAL_COMMUNITY)
Admission: EM | Admit: 2018-07-25 | Discharge: 2018-07-25 | Disposition: A | Discharge status: Home / Self Care | Payer: Self-pay | Attending: Emergency Medicine | Admitting: Emergency Medicine

## 2018-07-25 ENCOUNTER — Encounter (HOSPITAL_COMMUNITY): Payer: Self-pay | Admitting: Emergency Medicine

## 2018-07-25 ENCOUNTER — Emergency Department (HOSPITAL_COMMUNITY): Payer: Self-pay

## 2018-07-25 DIAGNOSIS — S93402A Sprain of unspecified ligament of left ankle, initial encounter: Secondary | ICD-10-CM | POA: Insufficient documentation

## 2018-07-25 DIAGNOSIS — F17210 Nicotine dependence, cigarettes, uncomplicated: Secondary | ICD-10-CM | POA: Insufficient documentation

## 2018-07-25 DIAGNOSIS — Z79899 Other long term (current) drug therapy: Secondary | ICD-10-CM | POA: Insufficient documentation

## 2018-07-25 DIAGNOSIS — Y999 Unspecified external cause status: Secondary | ICD-10-CM | POA: Insufficient documentation

## 2018-07-25 DIAGNOSIS — Y929 Unspecified place or not applicable: Secondary | ICD-10-CM | POA: Insufficient documentation

## 2018-07-25 DIAGNOSIS — W109XXA Fall (on) (from) unspecified stairs and steps, initial encounter: Secondary | ICD-10-CM | POA: Insufficient documentation

## 2018-07-25 DIAGNOSIS — Y939 Activity, unspecified: Secondary | ICD-10-CM | POA: Insufficient documentation

## 2018-07-25 MED ORDER — HYDROCODONE-ACETAMINOPHEN 5-325 MG PO TABS
2.0000 | ORAL_TABLET | Freq: Once | ORAL | Status: AC
  Administered 2018-07-25: 2 via ORAL
  Filled 2018-07-25: qty 2

## 2018-07-25 MED ORDER — IBUPROFEN 800 MG PO TABS: 800.0000 mg | ORAL_TABLET | Freq: Three times a day (TID) | ORAL | 0 refills | Status: AC

## 2018-07-25 NOTE — ED Notes (Signed)
Patient transported to X-ray 

## 2018-07-25 NOTE — ED Provider Notes (Signed)
[facility] EMERGENC[facility] DEPARTMENT Provider Note   CSN: [id number] Arrival date & time: 07/25/18  0447    History   Chief Complaint Chief Complaint  Patient presents with  . Ankle Pain    HPI Miranda Jackson is a 48 y.o. female.     Patient presents to the emergency department with a chief complaint of left ankle pain.  She states that she stumbled while going down the stairs tonight.  She felt a pop.  She complains of moderate to severe pain.  The pain is worsened with ambulation and palpation.  She denies any treatments prior to arrival.  Denies any numbness, weakness, or tingling.  Denies any other associated symptoms.  The history is provided by the patient. No language interpreter was used.    Past Medical History:  Diagnosis Date  . Alcohol abuse   . Anemia   . Fibroids   . SVD (spontaneous vaginal delivery)    x 3  . Tobacco dependence     Patient Active Problem List   Diagnosis Date Noted  . Postoperative state 06/06/2013  . Anemia 03/23/2013  . Leiomyoma of uterus, unspecified 03/15/2012    Past Surgical History:  Procedure Laterality Date  . ABDOMINAL HYSTERECTOMY N/A 06/06/2013   Procedure: HYSTERECTOMY ABDOMINAL;  Surgeon: Lavonia Drafts, MD;  Location: [facility];  Service: Gynecology;  Laterality: N/A;  . BILATERAL SALPINGECTOMY Bilateral 06/06/2013   Procedure: BILATERAL SALPINGECTOMY;  Surgeon: Lavonia Drafts, MD;  Location: [facility];  Service: Gynecology;  Laterality: Bilateral;  . [place] SURGERY  2012    in [place]  . TUBAL LIGATION  1993  . WISDOM TOOTH EXTRACTION  2004    in [place]     OB History    Gravida  3   Para  3   Term  3   Preterm      AB      Living  3     SAB      TAB      Ectopic      Multiple      Live Births  3            Home Medications    Prior to Admission medications   Medication Sig Start Date End Date Taking? Authorizing Provider  ibuprofen (ADVIL,MOTRIN) 800  MG tablet Take 1 tablet (800 mg total) by mouth every 8 (eight) hours as needed. 08/20/17   Ok Edwards, PA-C    Family History Family History  Problem Relation Age of Onset  . Hypertension Maternal Grandfather   . Heart disease Mother        heart attack    Social History Social History   Tobacco Use  . Smoking status: Current Every Day Smoker    Packs/day: 0.50    Years: 21.00    Pack years: 10.50    Types: Cigarettes  . Smokeless tobacco: Never Used  Substance Use Topics  . Alcohol use: No    Alcohol/week: 42.0 standard drinks    Types: 42 Cans of beer per week    Comment: daily six pack-former  . Drug use: No     Allergies   Dilaudid [hydromorphone hcl]   Review of Systems Review of Systems  All other systems reviewed and are negative.    Physical Exam Updated Vital Signs BP 114/84   Pulse 63   Temp 97.9 F (36.6 C) (Oral)   Ht 5\' 3"  (1.6 m)   Wt 64.4 kg  LMP 05/25/2013   SpO2 100%   BMI 25.15 kg/m   Physical Exam  Nursing note and vitals reviewed.  Constitutional: Pt appears well-developed and well-nourished. No distress.  HENT:  Head: Normocephalic and atraumatic.  Eyes: Conjunctivae are normal.  Neck: Normal range of motion.  Cardiovascular: Normal rate, regular rhythm. Intact distal pulses.   Capillary refill < 3 sec.  Pulmonary/Chest: Effort normal and breath sounds normal.  Musculoskeletal:  LLE Pt exhibits TTP over the left lateral malleolus with associated swelling.   ROM: 4/5 limited by pain  Strength: 4/5 limited by pain  Neurological: Pt  is alert. Coordination normal.  Sensation: 5/5 Skin: Skin is warm and dry. Pt is not diaphoretic.  No evidence of open wound or skin tenting Psychiatric: Pt has a normal mood and affect.    ED Treatments / Results  Labs (all labs ordered are listed, but only abnormal results are displayed) Labs Reviewed - No data to display  EKG None  Radiology Dg Ankle Complete Left  Result Date:  07/25/2018 CLINICAL DATA:  Fall, left ankle pain/swelling EXAM: LEFT ANKLE COMPLETE - 3+ VIEW COMPARISON:  None. FINDINGS: No fracture or dislocation is seen. The ankle mortise is intact. The base of the fifth metatarsal is unremarkable. Small plantar calcaneal enthesophyte. Moderate lateral soft tissue swelling. IMPRESSION: No fracture or dislocation is seen. Moderate lateral soft tissue swelling. Electronically Signed   By: Julian Hy M.D.   On: 07/25/2018 05:45    Procedures Procedures (including critical care time)  Medications Ordered in ED Medications  HYDROcodone-acetaminophen (NORCO/VICODIN) 5-325 MG per tablet 2 tablet (2 tablets Oral Given 07/25/18 0545)     Initial Impression / Assessment and Plan / ED Course  I have reviewed the triage vital signs and the nursing notes.  Pertinent labs & imaging results that were available during my care of the patient were reviewed by me and considered in my medical decision making (see chart for details).       Patient presents with injury to left ankle.  DDx includes, fracture, strain, or sprain.  Consultants: none  Plain films reveal no fracture or dislocation.  Pt advised to follow up with PCP and/or orthopedics. Patient given ankle ASO and crutches while in ED, conservative therapy such as RICE recommended and discussed.   Patient will be discharged home & is agreeable with above plan. Returns precautions discussed. Pt appears safe for discharge.   Final Clinical Impressions(s) / ED Diagnoses   Final diagnoses:  Sprain of left ankle, unspecified ligament, initial encounter    ED Discharge Orders         Ordered    ibuprofen (ADVIL,MOTRIN) 800 MG tablet  3 times daily     07/25/18 0604           Montine Circle, PA-C 07/25/18 0604    Ezequiel Essex, MD 07/25/18 [phone]

## 2018-07-25 NOTE — ED Triage Notes (Signed)
Brought by ems after falling down a few steps.  Reports pain in left ankle.  Swelling noted.

## 2018-07-25 NOTE — ED Notes (Signed)
Discharge instructions, prescriptions, and crutch use discussed with pt. And significant other. Teach back method used. Pt. States no other questions at the time. Pt. Discharged wheelchair to home.

## 2018-08-02 ENCOUNTER — Telehealth (INDEPENDENT_AMBULATORY_CARE_PROVIDER_SITE_OTHER): Payer: Self-pay

## 2018-08-02 NOTE — Telephone Encounter (Signed)
Called patient and asked the screening questions.  Do you have now or have you had in the past 7 days a fever and/or chills? NO  Do you have now or have you had in the past 7 days a cough? NO  Do you have now or have you had in the last 7 days nausea, vomiting or abdominal pain? NO  Have you been exposed to anyone who has tested positive for COVID-19? NO  Have you or anyone who lives with you traveled within the last month? NO 

## 2018-08-03 ENCOUNTER — Encounter (INDEPENDENT_AMBULATORY_CARE_PROVIDER_SITE_OTHER): Payer: Self-pay | Admitting: Orthopaedic Surgery

## 2018-08-03 ENCOUNTER — Ambulatory Visit (INDEPENDENT_AMBULATORY_CARE_PROVIDER_SITE_OTHER): Payer: Self-pay | Admitting: Orthopaedic Surgery

## 2018-08-03 ENCOUNTER — Other Ambulatory Visit: Payer: Self-pay

## 2018-08-03 VITALS — Ht 63.0 in | Wt 143.0 lb

## 2018-08-03 DIAGNOSIS — IMO0001 Reserved for inherently not codable concepts without codable children: Secondary | ICD-10-CM

## 2018-08-03 DIAGNOSIS — W1840XA Slipping, tripping and stumbling without falling, unspecified, initial encounter: Secondary | ICD-10-CM

## 2018-08-03 DIAGNOSIS — S93402A Sprain of unspecified ligament of left ankle, initial encounter: Secondary | ICD-10-CM

## 2018-08-03 DIAGNOSIS — S93409A Sprain of unspecified ligament of unspecified ankle, initial encounter: Secondary | ICD-10-CM

## 2018-08-03 DIAGNOSIS — Z720 Tobacco use: Secondary | ICD-10-CM

## 2018-08-03 NOTE — Progress Notes (Signed)
Office Visit Note   Patient: Miranda Jackson           Date of Birth: 02/23/1971           MRN: [id number] Visit Date: 08/03/2018              Requested by: Lanae Boast, [place], [place] [address] PCP: Lanae Boast, FNP   Assessment & Plan: Visit Diagnoses:  1. Grade 2 ankle sprain, unspecified laterality, initial encounter     Plan: Continue ice anti-inflammatories elevation Swede-O and crutch.  I plan to recheck her again in 4 weeks.  She will wear a sock underneath the Swede-O to help prevent skin irritation.  No x-ray needed on return.  Work slip given no work x4 weeks since her job involves her being on her feet a lot.  Follow-Up Instructions: Return in about 4 weeks (around 08/31/2018).   Orders:  No orders of the defined types were placed in this encounter.  No orders of the defined types were placed in this encounter.     Procedures: No procedures performed   Clinical Data: No additional findings.   Subjective: Chief Complaint  Patient presents with  . Left Ankle - Pain    DOI 07/25/2018    HPI 48 year old female seen with new left ankle injury when she stumbled going down the stairs when she stepped on a small cross stick that she is to jam the door.  When she stepped on it she rolled her ankle and had a lateral ankle pain.  She was seen in the emergency room where x-rays were negative she was placed in an ASO given crutches and has weaned down to just 1 crutch.  She has had burning pain laterally.  She is used ibuprofen.  Patient is tried ice.  Ibuprofen seemed to help the most.  No past history of injury to the ankle that she can recall.  Review of Systems noncontributory positive for uterine leiomyoma as well as anemia otherwise negative.  14 point system update.   Objective: Vital Signs: Ht 5\' 3"  (1.6 m)   Wt 143 lb (64.9 kg)   LMP 05/25/2013   BMI 25.33 kg/m   Physical Exam Constitutional:      Appearance: She is  well-developed.  HENT:     Head: Normocephalic.     Right Ear: External ear normal.     Left Ear: External ear normal.  Eyes:     Pupils: Pupils are equal, round, and reactive to light.  Neck:     Thyroid: No thyromegaly.     Trachea: No tracheal deviation.  Cardiovascular:     Rate and Rhythm: Normal rate.  Pulmonary:     Effort: Pulmonary effort is normal.  Abdominal:     Palpations: Abdomen is soft.  Skin:    General: Skin is warm and dry.  Neurological:     Mental Status: She is alert and oriented to person, place, and time.  Psychiatric:        Behavior: Behavior normal.     Ortho Exam patient has a tenderness over the anterior talar fib 1+ anterior drawer.  Negative lateral talar tilt.  No significant tenderness over the medial deltoid ligament.  Slight tenderness of the syndesmosis.  Negative squeeze test.  Pulses are intact she does not have any ecchymosis at this time.  Specialty Comments:  No specialty comments available.  Imaging: No results found.   PMFS History: Patient Active  Problem List   Diagnosis Date Noted  . Postoperative state 06/06/2013  . Anemia 03/23/2013  . Leiomyoma of uterus, unspecified 03/15/2012   Past Medical History:  Diagnosis Date  . Alcohol abuse   . Anemia   . Fibroids   . SVD (spontaneous vaginal delivery)    x 3  . Tobacco dependence     Family History  Problem Relation Age of Onset  . Hypertension Maternal Grandfather   . Heart disease Mother        heart attack    Past Surgical History:  Procedure Laterality Date  . ABDOMINAL HYSTERECTOMY N/A 06/06/2013   Procedure: HYSTERECTOMY ABDOMINAL;  Surgeon: Lavonia Drafts, MD;  Location: [facility];  Service: Gynecology;  Laterality: N/A;  . BILATERAL SALPINGECTOMY Bilateral 06/06/2013   Procedure: BILATERAL SALPINGECTOMY;  Surgeon: Lavonia Drafts, MD;  Location: [facility];  Service: Gynecology;  Laterality: Bilateral;  . [place] SURGERY  2012    in [place]   . TUBAL LIGATION  1993  . WISDOM TOOTH EXTRACTION  2004    in [facility] History   Occupational History  . Not on file  Tobacco Use  . Smoking status: Current Every Day Smoker    Packs/day: 0.50    Years: 21.00    Pack years: 10.50    Types: Cigarettes  . Smokeless tobacco: Never Used  Substance and Sexual Activity  . Alcohol use: No    Alcohol/week: 42.0 standard drinks    Types: 42 Cans of beer per week    Comment: daily six pack-former  . Drug use: No  . Sexual activity: Yes    Birth control/protection: Surgical

## 2018-08-04 ENCOUNTER — Encounter (INDEPENDENT_AMBULATORY_CARE_PROVIDER_SITE_OTHER): Payer: Self-pay | Admitting: Orthopaedic Surgery

## 2018-08-16 ENCOUNTER — Telehealth (INDEPENDENT_AMBULATORY_CARE_PROVIDER_SITE_OTHER): Payer: Self-pay | Admitting: Orthopaedic Surgery

## 2018-08-16 NOTE — Telephone Encounter (Signed)
Patient request a return to work note. Patient states she will be returning to work on 08/23/18.

## 2018-08-16 NOTE — Telephone Encounter (Signed)
Ok for note 

## 2018-08-17 NOTE — Telephone Encounter (Signed)
I called and spoke with patient. She asked for me to be sure that her note stated Miranda Jackson as her employer has her listed under her maiden name. Note entered into system and faxed to patient's employer at [phone] per patient request.

## 2018-08-17 NOTE — Telephone Encounter (Signed)
Ok thanks 

## 2018-11-24 ENCOUNTER — Ambulatory Visit
Admission: EM | Admit: 2018-11-24 | Discharge: 2018-11-24 | Disposition: A | Discharge status: Home / Self Care | Payer: Self-pay | Attending: Physician Assistant | Admitting: Physician Assistant

## 2018-11-24 DIAGNOSIS — R21 Rash and other nonspecific skin eruption: Secondary | ICD-10-CM

## 2018-11-24 MED ORDER — HYDROXYZINE HCL 25 MG PO TABS: 25.0000 mg | ORAL_TABLET | Freq: Four times a day (QID) | ORAL | 0 refills | Status: AC

## 2018-11-24 MED ORDER — PREDNISONE 50 MG PO TABS: 50.0000 mg | ORAL_TABLET | Freq: Every day | ORAL | 0 refills | Status: DC
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
## 2018-11-24 NOTE — Discharge Instructions (Signed)
Start prednisone as directed. Hydroxyzine as needed for itching. If hydroxyzine makes you drowsy, you can use over the counter zyrtec/claritin instead. Monitor for any changes in hygiene product. Follow up here or with PCP if symptoms not improving.
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
## 2019-01-12 ENCOUNTER — Ambulatory Visit: Payer: Self-pay

## 2019-01-12 ENCOUNTER — Other Ambulatory Visit: Payer: Self-pay | Admitting: Family Medicine

## 2019-01-12 ENCOUNTER — Other Ambulatory Visit: Payer: Self-pay

## 2019-01-12 DIAGNOSIS — M545 Low back pain, unspecified: Secondary | ICD-10-CM

## 2019-01-19 ENCOUNTER — Encounter (HOSPITAL_COMMUNITY): Payer: Self-pay

## 2019-01-19 ENCOUNTER — Encounter (HOSPITAL_COMMUNITY): Payer: Self-pay | Admitting: *Deleted

## 2020-09-22 ENCOUNTER — Ambulatory Visit (HOSPITAL_COMMUNITY)
Admission: EM | Admit: 2020-09-22 | Discharge: 2020-09-22 | Disposition: A | Discharge status: Home / Self Care | Payer: Self-pay | Attending: Emergency Medicine | Admitting: Emergency Medicine

## 2020-09-22 ENCOUNTER — Other Ambulatory Visit: Payer: Self-pay

## 2020-09-22 ENCOUNTER — Encounter (HOSPITAL_COMMUNITY): Payer: Self-pay | Admitting: *Deleted

## 2020-09-22 ENCOUNTER — Ambulatory Visit: Admission: EM | Admit: 2020-09-22 | Discharge: 2020-09-22 | Discharge status: Against Medical Advice | Payer: Self-pay

## 2020-09-22 DIAGNOSIS — K029 Dental caries, unspecified: Secondary | ICD-10-CM

## 2020-09-22 DIAGNOSIS — K047 Periapical abscess without sinus: Secondary | ICD-10-CM

## 2020-09-22 MED ORDER — AMOXICILLIN-POT CLAVULANATE 875-125 MG PO TABS: 1.0000 | ORAL_TABLET | Freq: Two times a day (BID) | ORAL | 0 refills | Status: AC

## 2020-09-22 NOTE — ED Provider Notes (Signed)
[facility]    CSN: [id number] Arrival date & time: 09/22/20  1308      History   Chief Complaint Chief Complaint  Patient presents with  . Dental Pain    HPI Miranda Jackson is a 50 y.o. female.   Patient here for evaluation of left sided dental pain and face swelling.  Reports developed pain and swelling on Monday.  Reports taking Aleve, Orajel, and salt water gargles for pain and swelling with minimal relief.  Reports having a cracked tooth on the lower left jaw. Denies any trauma, injury, or other precipitating event.  Denies any fevers, chest pain, shortness of breath, N/V/D, numbness, tingling, weakness, abdominal pain, or headaches.    ROS: As per HPI, all other pertinent ROS negative   The history is provided by the patient.  Dental Pain Associated symptoms: facial swelling     Past Medical History:  Diagnosis Date  . Alcohol abuse   . Anemia   . Fibroids   . SVD (spontaneous vaginal delivery)    x 3  . Tobacco dependence     Patient Active Problem List   Diagnosis Date Noted  . Postoperative state 06/06/2013  . Anemia 03/23/2013  . Leiomyoma of uterus, unspecified 03/15/2012    Past Surgical History:  Procedure Laterality Date  . ABDOMINAL HYSTERECTOMY N/A 06/06/2013   Procedure: HYSTERECTOMY ABDOMINAL;  Surgeon: Lavonia Drafts, MD;  Location: [facility];  Service: Gynecology;  Laterality: N/A;  . BILATERAL SALPINGECTOMY Bilateral 06/06/2013   Procedure: BILATERAL SALPINGECTOMY;  Surgeon: Lavonia Drafts, MD;  Location: [facility];  Service: Gynecology;  Laterality: Bilateral;  . [place] SURGERY  2012    in [place]  . TUBAL LIGATION  1993  . WISDOM TOOTH EXTRACTION  2004    in [place]    OB History    Gravida  3   Para  3   Term  3   Preterm      AB      Living  3     SAB      IAB      Ectopic      Multiple      Live Births  3            Home Medications    Prior to Admission medications   Medication  Sig Start Date End Date Taking? Authorizing Provider  amoxicillin-clavulanate (AUGMENTIN) 875-125 MG tablet Take 1 tablet by mouth every 12 (twelve) hours for 7 days. 09/22/20 09/29/20 Yes Pearson Forster, NP  hydrOXYzine (ATARAX/VISTARIL) 25 MG tablet Take 1 tablet (25 mg total) by mouth every 6 (six) hours. 11/24/18   Tasia Catchings, Amy V, PA-C  ibuprofen (ADVIL,MOTRIN) 800 MG tablet Take 1 tablet (800 mg total) by mouth 3 (three) times daily. Patient not taking: Reported on 08/03/2018 07/25/18   Montine Circle, PA-C  predniSONE (DELTASONE) 50 MG tablet Take 1 tablet (50 mg total) by mouth daily with breakfast. 11/24/18   Ok Edwards, PA-C    Family History Family History  Problem Relation Age of Onset  . Hypertension Maternal Grandfather   . Heart disease Mother        heart attack    Social History Social History   Tobacco Use  . Smoking status: Current Every Day Smoker    Packs/day: 0.50    Years: 21.00    Pack years: 10.50    Types: Cigarettes  . Smokeless tobacco: Never Used  Substance Use Topics  . Alcohol use:  No    Alcohol/week: 42.0 standard drinks    Types: 42 Cans of beer per week    Comment: daily six pack-former  . Drug use: No     Allergies   Dilaudid [hydromorphone hcl]   Review of Systems Review of Systems  HENT: Positive for facial swelling.   All other systems reviewed and are negative.    Physical Exam Triage Vital Signs ED Triage Vitals [09/22/20 1356]  Enc Vitals Group     BP 94/61     Pulse Rate 61     Resp 18     Temp 98.3 F (36.8 C)     Temp Source Oral     SpO2 100 %     Weight      Height      Head Circumference      Peak Flow      Pain Score 10     Pain Loc      Pain Edu?      Excl. in [place]?    No data found.  Updated Vital Signs BP 94/61   Pulse 61   Temp 98.3 F (36.8 C) (Oral)   Resp 18   LMP 05/25/2013   SpO2 100%   Visual Acuity Right Eye Distance:   Left Eye Distance:   Bilateral Distance:    Right Eye Near:   Left  Eye Near:    Bilateral Near:     Physical Exam Vitals and nursing note reviewed.  Constitutional:      General: She is not in acute distress.    Appearance: Normal appearance. She is not ill-appearing, toxic-appearing or diaphoretic.  HENT:     Head: Normocephalic and atraumatic.     Mouth/Throat:     Dentition: Abnormal dentition. Dental caries and dental abscesses present.  Eyes:     Conjunctiva/sclera: Conjunctivae normal.  Cardiovascular:     Rate and Rhythm: Normal rate.     Pulses: Normal pulses.  Pulmonary:     Effort: Pulmonary effort is normal.  Abdominal:     General: Abdomen is flat.  Musculoskeletal:        General: Normal range of motion.     Cervical back: Normal range of motion.  Skin:    General: Skin is warm and dry.  Neurological:     General: No focal deficit present.     Mental Status: She is alert and oriented to person, place, and time.  Psychiatric:        Mood and Affect: Mood normal.      UC Treatments / Results  Labs (all labs ordered are listed, but only abnormal results are displayed) Labs Reviewed - No data to display  EKG   Radiology No results found.  Procedures Procedures (including critical care time)  Medications Ordered in UC Medications - No data to display  Initial Impression / Assessment and Plan / UC Course  I have reviewed the triage vital signs and the nursing notes.  Pertinent labs & imaging results that were available during my care of the patient were reviewed by me and considered in my medical decision making (see chart for details).    Assessment negative for red flags or concerns.  Augmentin twice a day for the next 7 days.  Continue to use Aleve and orajel for pain.  Follow up with a dentist as soon as possible.  Patient given reduced cost dental resource sheet.  Verbalized understanding.  Final Clinical Impressions(s) / UC Diagnoses  Final diagnoses:  Dental abscess  Pain due to dental caries      Discharge Instructions     Take the augmentin one pill twice a day for the next 7 days.    You can continue to use Orajel and Aleve for pain.  You can also take Tylenol.   Follow up with a dentist as soon as possible.     ED Prescriptions    Medication Sig Dispense Auth. Provider   amoxicillin-clavulanate (AUGMENTIN) 875-125 MG tablet Take 1 tablet by mouth every 12 (twelve) hours for 7 days. 14 tablet Pearson Forster, NP     PDMP not reviewed this encounter.   Pearson Forster, NP 09/22/20 1425

## 2020-09-22 NOTE — ED Triage Notes (Signed)
Pt reports dental pain for one week. Causing facial pain on left.

## 2020-09-22 NOTE — Discharge Instructions (Addendum)
Take the augmentin one pill twice a day for the next 7 days.    You can continue to use Orajel and Aleve for pain.  You can also take Tylenol.   Follow up with a dentist as soon as possible.

## 2021-07-18 ENCOUNTER — Encounter (HOSPITAL_COMMUNITY): Payer: Self-pay | Admitting: Emergency Medicine

## 2021-07-18 ENCOUNTER — Emergency Department (HOSPITAL_COMMUNITY)
Admission: EM | Admit: 2021-07-18 | Discharge: 2021-07-18 | Disposition: A | Discharge status: Home / Self Care | Payer: Self-pay | Attending: Student | Admitting: Student

## 2021-07-18 ENCOUNTER — Emergency Department (HOSPITAL_COMMUNITY): Payer: Self-pay

## 2021-07-18 ENCOUNTER — Other Ambulatory Visit: Payer: Self-pay

## 2021-07-18 DIAGNOSIS — R1011 Right upper quadrant pain: Secondary | ICD-10-CM

## 2021-07-18 DIAGNOSIS — K852 Alcohol induced acute pancreatitis without necrosis or infection: Secondary | ICD-10-CM | POA: Insufficient documentation

## 2021-07-18 LAB — CBC
HCT: 40.7 % (ref 36.0–46.0)
Hemoglobin: 13 g/dL (ref 12.0–15.0)
MCH: 30.6 pg (ref 26.0–34.0)
MCHC: 31.9 g/dL (ref 30.0–36.0)
MCV: 95.8 fL (ref 80.0–100.0)
Platelets: 197 10*3/uL (ref 150–400)
RBC: 4.25 MIL/uL (ref 3.87–5.11)
RDW: 12.6 % (ref 11.5–15.5)
WBC: 5.7 10*3/uL (ref 4.0–10.5)
nRBC: 0 % (ref 0.0–0.2)

## 2021-07-18 LAB — URINALYSIS, ROUTINE W REFLEX MICROSCOPIC
Bilirubin Urine: NEGATIVE
Glucose, UA: NEGATIVE mg/dL
Hgb urine dipstick: NEGATIVE
Ketones, ur: NEGATIVE mg/dL
Leukocytes,Ua: NEGATIVE
Nitrite: NEGATIVE
Protein, ur: NEGATIVE mg/dL
Specific Gravity, Urine: 1.008 (ref 1.005–1.030)
pH: 6 (ref 5.0–8.0)

## 2021-07-18 LAB — COMPREHENSIVE METABOLIC PANEL
ALT: 14 U/L (ref 0–44)
AST: 17 U/L (ref 15–41)
Albumin: 4.7 g/dL (ref 3.5–5.0)
Alkaline Phosphatase: 53 U/L (ref 38–126)
Anion gap: 9 (ref 5–15)
BUN: 13 mg/dL (ref 6–20)
CO2: 27 mmol/L (ref 22–32)
Calcium: 9.6 mg/dL (ref 8.9–10.3)
Chloride: 101 mmol/L (ref 98–111)
Creatinine, Ser: 0.86 mg/dL (ref 0.44–1.00)
GFR, Estimated: >60 mL/min (ref >60)
Glucose, Bld: 86 mg/dL (ref 70–99)
Potassium: 3.8 mmol/L (ref 3.5–5.1)
Sodium: 137 mmol/L (ref 135–145)
Total Bilirubin: 0.4 mg/dL (ref 0.3–1.2)
Total Protein: 8.4 g/dL — ABNORMAL HIGH (ref 6.5–8.1)

## 2021-07-18 LAB — LIPASE, BLOOD: Lipase: 337 U/L — ABNORMAL HIGH (ref 11–51)

## 2021-07-18 MED ORDER — ONDANSETRON HCL 4 MG PO TABS: 4.0000 mg | ORAL_TABLET | Freq: Four times a day (QID) | ORAL | 0 refills | Status: AC

## 2021-07-18 MED ORDER — OXYCODONE-ACETAMINOPHEN 5-325 MG PO TABS: 1.0000 | ORAL_TABLET | Freq: Four times a day (QID) | ORAL | 0 refills | Status: AC | PRN

## 2021-07-18 NOTE — ED Provider Triage Note (Signed)
Emergency Medicine Provider Triage Evaluation Note ? ?[employer] , a 51 y.o. female  was evaluated in triage.  Pt complains of RUQ abdominal pain since Thursday.  She endorses history of total hysterectomy somewhat recently, but denies any other abdominal surgeries, or significant medical issues.  She has had some anemia in the past.  She reports that she has had some nausea on and off, as well as a burning sensation in her epigastric region.  She denies any diarrhea, she was initially suspicious that she might actually be constipated.  No improvement after milk of magnesia, patient has had a bowel movement since then. ? ?Review of Systems  ?Positive: Abdominal pain, nausea ?Negative: Vomiting, diarrhea, chest pain, shob ? ?Physical Exam  ?BP 110/76 (BP Location: Left Arm)   Pulse 72   Temp 98.2 ?F (36.8 ?C) (Oral)   Resp 18   Ht '5\' 3"'$  (1.6 m)   Wt 65 kg   LMP 05/25/2013   SpO2 97%   BMI 25.38 kg/m?  ?Gen:   Awake, no distress   ?Resp:  Normal effort  ?MSK:   Moves extremities without difficulty  ?Other:  TTP RUQ, positive Murphy ? ?Medical Decision Making  ?Medically screening exam initiated at 2:18 PM.  Appropriate orders placed.  Shuna Swartz was informed that the remainder of the evaluation will be completed by another provider, this initial triage assessment does not replace that evaluation, and the importance of remaining in the ED until their evaluation is complete. ? ?Workup initiated ?  ?Anselmo Pickler, PA-C ?07/18/21 1419 ? ?

## 2021-07-18 NOTE — ED Provider Notes (Signed)
[facility] DEPT Provider Note   CSN: [id number] Arrival date & time: 07/18/21  1354     History  Chief Complaint  Patient presents with   Abdominal Pain    Miranda Jackson is a 51 y.o. female with past medical history significant for anemia, tobacco use, history of alcohol abuse.  Patient reports that she has stopped drinking, has not had anything to drink in a couple of weeks.  Patient reports with right upper quadrant pain, and some right shoulder discomfort last week.  She denies any diarrhea, constipation, or significant nausea or vomiting.  She reports the pain is more annoying than significantly painful.  She does describe it as burning.  Today it was slightly worse than previously she rated an 8/10.  Patient with no history of gallstones, or pancreatitis in the past.   Abdominal Pain     Home Medications Prior to Admission medications   Medication Sig Start Date End Date Taking? Authorizing Provider  ondansetron (ZOFRAN) 4 MG tablet Take 1 tablet (4 mg total) by mouth every 6 (six) hours. 07/18/21  Yes Laquasia Pincus H, PA-C  oxyCODONE-acetaminophen (PERCOCET/ROXICET) 5-325 MG tablet Take 1 tablet by mouth every 6 (six) hours as needed for severe pain. 07/18/21  Yes Kharter Sestak H, PA-C  hydrOXYzine (ATARAX/VISTARIL) 25 MG tablet Take 1 tablet (25 mg total) by mouth every 6 (six) hours. 11/24/18   Tasia Catchings, Amy V, PA-C  ibuprofen (ADVIL,MOTRIN) 800 MG tablet Take 1 tablet (800 mg total) by mouth 3 (three) times daily. Patient not taking: Reported on 08/03/2018 07/25/18   Montine Circle, PA-C  predniSONE (DELTASONE) 50 MG tablet Take 1 tablet (50 mg total) by mouth daily with breakfast. 11/24/18   Ok Edwards, PA-C      Allergies    Dilaudid [hydromorphone hcl]    Review of Systems   Review of Systems  Gastrointestinal:  Positive for abdominal pain.  All other systems reviewed and are negative.  Physical Exam Updated Vital Signs BP 110/76  (BP Location: Left Arm)    Pulse 72    Temp 98.2 F (36.8 C) (Oral)    Resp 18    Ht '5\' 3"'$  (1.6 m)    Wt 65 kg    LMP 05/25/2013    SpO2 97%    BMI 25.38 kg/m  Physical Exam Vitals and nursing note reviewed.  Constitutional:      General: She is not in acute distress.    Appearance: Normal appearance.  HENT:     Head: Normocephalic and atraumatic.  Eyes:     General:        Right eye: No discharge.        Left eye: No discharge.  Cardiovascular:     Rate and Rhythm: Normal rate and regular rhythm.     Heart sounds: No murmur heard.   No friction rub. No gallop.  Pulmonary:     Effort: Pulmonary effort is normal.     Breath sounds: Normal breath sounds.  Abdominal:     General: Bowel sounds are normal.     Palpations: Abdomen is soft.     Comments: Patient with some tenderness to palpation in the epigastric region/right upper quadrant.  Positive Murphy sign.  No rebound, rigidity, guarding throughout.  Overall patient does endorse some pain but appears fairly comfortable during abdominal exam.  Normal bowel sounds throughout.  Skin:    General: Skin is warm and dry.     Capillary Refill: Capillary  refill takes less than 2 seconds.  Neurological:     Mental Status: She is alert and oriented to person, place, and time.  Psychiatric:        Mood and Affect: Mood normal.        Behavior: Behavior normal.    ED Results / Procedures / Treatments   Labs (all labs ordered are listed, but only abnormal results are displayed) Labs Reviewed  LIPASE, BLOOD - Abnormal; Notable for the following components:      Result Value   Lipase 337 (*)    All other components within normal limits  COMPREHENSIVE METABOLIC PANEL - Abnormal; Notable for the following components:   Total Protein 8.4 (*)    All other components within normal limits  CBC  URINALYSIS, ROUTINE W REFLEX MICROSCOPIC    EKG None  Radiology US Abdomen Limited RUQ (LIVER/GB)  Result Date: 07/18/2021 CLINICAL DATA:   Right upper quadrant pain EXAM: ULTRASOUND ABDOMEN LIMITED RIGHT UPPER QUADRANT COMPARISON:  None. FINDINGS: Gallbladder: No gallstones or wall thickening visualized. No sonographic Murphy sign noted by sonographer. Common bile duct: Diameter: 6 mm Liver: No focal lesion identified. Within normal limits in parenchymal echogenicity. Portal vein is patent on color Doppler imaging with normal direction of blood flow towards the liver. Other: None. IMPRESSION: Unremarkable right upper quadrant sonogram. Electronically Signed   By: Ofilia Neas M.D.   On: 07/18/2021 14:47    Procedures Procedures    Medications Ordered in ED Medications - No data to display  ED Course/ Medical Decision Making/ A&P                           Medical Decision Making Amount and/or Complexity of Data Reviewed Labs: ordered. Radiology: ordered.   I discussed this case with my attending physician who cosigned this note including patient's presenting symptoms, physical exam, and planned diagnostics and interventions. Attending physician stated agreement with plan or made changes to plan which were implemented.   Attending physician assessed patient at bedside.  This patient presents to the ED for concern of epigastric, right upper quadrant pain, this involves an extensive number of treatment options, and is a complaint that carries with it a high risk of complications and morbidity. The emergent differential diagnosis prior to evaluation includes, but is not limited to, cholecystitis, cholangitis, pancreatitis, mesenteric ischemia, versus other acute or intra-abdominal surgical concerns.  Also considered nephrolithiasis, pyelonephritis..   This is not an exhaustive differential.   Past Medical History / Co-morbidities / Social History: History of alcohol abuse, tobacco abuse, anemia  Additional history: External records from outside source obtained and reviewed including outpatient urgent care, recent emergency  department visits, outpatient OB/GYN visits..  Physical Exam: Physical exam performed. The pertinent findings include: Patient with tenderness to palpation epigastric versus right upper quadrant region.  She does seem to have a positive Murphy sign.  Otherwise overall well appearing, smiling throughout interview.  Lab Tests: I ordered, and personally interpreted labs.  The pertinent results include: Unremarkable CMP, unremarkable CBC.  Patient with lipase elevated 2-3 37 which is greater than 3 times upper limit of normal.   Imaging Studies: I ordered imaging studies including right upper quadrant ultrasound. I independently visualized and interpreted imaging which showed biliary sludge, ductal dilation, or other findings of acute cholecystitis. I agree with the radiologist interpretation.   Disposition: After consideration of the diagnostic results and the patients response to treatment, I feel that patient  appears overall well, is tolerating p.o. intake, is not in significant pain on reevaluation does not request pain medications in the emergency department.  Her lab work is overall unremarkable, her abdominal exam is benign I have low clinical suspicion for acute pancreatic pseudocyst, pancreatic rupture and this overall well-appearing patient.  Given patient's history of alcohol abuse in the past I believe that she has a simple pancreatitis secondary to her alcohol use.  Performed shared decision-making with patient on possible admission versus outpatient management.  After discussion with the patient she reports that she feels comfortable with discharge at this time.  We will discharge with pain medication, nausea medication, dietary precautions and plan to follow-up with PCP.  Patient reports that she is trying to establish care with a new PCP at this time, will place consult to the transitions of care team to help her.  Patient or stands and agrees to this plan, extensive return precautions given.    Final Clinical Impression(s) / ED Diagnoses Final diagnoses:  RUQ pain  Alcohol-induced acute pancreatitis, unspecified complication status    Rx / DC Orders ED Discharge Orders          Ordered    oxyCODONE-acetaminophen (PERCOCET/ROXICET) 5-325 MG tablet  Every 6 hours PRN        07/18/21 1621    ondansetron (ZOFRAN) 4 MG tablet  Every 6 hours        07/18/21 1621              Jodi Kappes, [place] H, PA-C 07/18/21 1647    Teressa Lower, MD 07/18/21 1712

## 2021-07-18 NOTE — ED Notes (Signed)
Pt given water and crackers for PO challenge

## 2021-07-18 NOTE — Discharge Instructions (Addendum)
As we discussed your work-up today is consistent with simple pancreatitis.  It is likely due to your history of alcohol use in the past.  Please continue to refrain from drinking as you have planned. Please use Tylenol or ibuprofen for pain.  You may use 600 mg ibuprofen every 6 hours or 1000 mg of Tylenol every 6 hours.  You may choose to alternate between the 2.  This would be most effective.  Not to exceed 4 g of Tylenol within 24 hours.  Not to exceed 3200 mg ibuprofen 24 hours. ? ?You can use the Percocet that I prescribed in place of the Tylenol above for any pain that is more severe.  I recommend you stick to a gentle diet, eat small meals, and drink plenty of fluids over the next few days.  You can use the Zofran as needed for nausea.  I placed a consult to the transitions of care team to help you establish a primary care doctor, please try to follow-up with primary care in the next 1 to 2 weeks to ensure improvement of symptoms.  Please return to the emergency department if your pain worsens, you are unable to tolerate eating or drinking, please return to the emergency department. ? ?I hope that you feel better soon, if you have any additional questions or concerns please return to the emergency department. ?

## 2021-07-18 NOTE — TOC Transition Note (Signed)
Transition of Care (TOC) - CM/SW Discharge Note ? ? ?Patient Details  ?Name: Miranda Jackson ?MRN: [id number] ?Date of Birth: July 27, 1970 ? ?Transition of Care (TOC) CM/SW Contact:  ?Roseanne Kaufman, RN ?Phone Number: ?07/18/2021, 4:56 PM ? ? ?Clinical Narrative:    ?Patient presented to [facility] ED with c/o RUQ abdominal pain since Thursday 07/11/21. RNCM received TOC consult for f/u appt to establish PCP.  ? ?  ?Patient Goals and CMS Choice ? Patient reports she just started a new job and currently does not have medical insurance coverage. RNCM explained that she will need a follow up appointment within the next 7 days. RNCM will call to schedule an appointment tomorrow since it's after 5pm. Patient prefers an appointment after 3pm ( she works 7a-3p). ? ?TOC will continue to monitor.  ?  ?Discharge Placement ? Discharge home ? ? ?Discharge Plan and Services ?  ? TOC Consult: Needs to establish with PCP ?           ?  Social Determinants of Health (SDOH) Interventions ?  ? ? ?Readmission Risk Interventions ?No flowsheet data found. ? ? ? ? ?

## 2021-07-18 NOTE — ED Triage Notes (Signed)
Reports R shoulder discomfort last week that has moved to her R upper abdominal area. Denies diarrhea, constipation, nausea or emesis.  ?

## 2021-07-19 ENCOUNTER — Telehealth: Payer: Self-pay

## 2021-07-19 NOTE — Telephone Encounter (Signed)
RNCM called to schedule PCP appointment to establish care and ED f/u. Currently [facility] does not have any appointments within the next 14 days. A nurse with [facility] will call patient to schedule appointment within 14 days. RNCM did advise patient works 7a-3p and needs an appointment after 3pm.  ? ?RNCM left voicemail with call back information for the patient, awaiting a response. ?

## 2022-04-11 ENCOUNTER — Ambulatory Visit: Payer: Self-pay | Admitting: Nurse Practitioner

## 2022-06-13 ENCOUNTER — Ambulatory Visit
Admission: EM | Admit: 2022-06-13 | Discharge: 2022-06-13 | Disposition: A | Discharge status: Home / Self Care | Payer: Self-pay | Attending: Physician Assistant | Admitting: Physician Assistant

## 2022-06-13 DIAGNOSIS — M25511 Pain in right shoulder: Secondary | ICD-10-CM

## 2022-06-13 MED ORDER — PREDNISONE 20 MG PO TABS: 40.0000 mg | ORAL_TABLET | Freq: Every day | ORAL | 0 refills | Status: AC

## 2022-06-13 NOTE — ED Triage Notes (Signed)
Pt presents with right side flank pain that radiates around to right arm down to right fingers X 1 week with no known injury or heavy lifting.

## 2022-06-13 NOTE — ED Provider Notes (Signed)
[facility]    CSN: [id number] Arrival date & time: 06/13/22  1216      History   Chief Complaint Chief Complaint  Patient presents with   Flank Pain   Arm Pain    HPI Miranda Jackson is a 52 y.o. female.   Patient here today for evaluation of right-sided upper posterior shoulder pain that radiates into her right arm that started about a week ago.  She reports that she has not had any injury that she is aware of.  She denies any numbness or tingling.  She does state that pain does radiate into her fingers at times.  She has taken over-the-counter medication without resolution. She denies any nausea, vomiting or diarrhea.   The history is provided by the patient.    Past Medical History:  Diagnosis Date   Alcohol abuse    Anemia    Fibroids    SVD (spontaneous vaginal delivery)    x 3   Tobacco dependence     Patient Active Problem List   Diagnosis Date Noted   Postoperative state 06/06/2013   Anemia 03/23/2013   Leiomyoma of uterus, unspecified 03/15/2012    Past Surgical History:  Procedure Laterality Date   ABDOMINAL HYSTERECTOMY N/A 06/06/2013   Procedure: HYSTERECTOMY ABDOMINAL;  Surgeon: Lavonia Drafts, MD;  Location: [facility];  Service: Gynecology;  Laterality: N/A;   BILATERAL SALPINGECTOMY Bilateral 06/06/2013   Procedure: BILATERAL SALPINGECTOMY;  Surgeon: Lavonia Drafts, MD;  Location: [facility];  Service: Gynecology;  Laterality: Bilateral;   CERVICAL DISC SURGERY  2012    in [place] EXTRACTION  2004    in [place]    OB History     Gravida  3   Para  3   Term  3   Preterm      AB      Living  3      SAB      IAB      Ectopic      Multiple      Live Births  3            Home Medications    Prior to Admission medications   Medication Sig Start Date End Date Taking? Authorizing Provider  predniSONE (DELTASONE) 20 MG tablet Take 2 tablets (40 mg total) by mouth daily  with breakfast for 5 days. 06/13/22 06/18/22 Yes Francene Finders, PA-C  hydrOXYzine (ATARAX/VISTARIL) 25 MG tablet Take 1 tablet (25 mg total) by mouth every 6 (six) hours. 11/24/18   Tasia Catchings, Amy V, PA-C  ibuprofen (ADVIL,MOTRIN) 800 MG tablet Take 1 tablet (800 mg total) by mouth 3 (three) times daily. Patient not taking: Reported on 08/03/2018 07/25/18   Montine Circle, PA-C  ondansetron (ZOFRAN) 4 MG tablet Take 1 tablet (4 mg total) by mouth every 6 (six) hours. 07/18/21   Prosperi, Christian H, PA-C  oxyCODONE-acetaminophen (PERCOCET/ROXICET) 5-325 MG tablet Take 1 tablet by mouth every 6 (six) hours as needed for severe pain. 07/18/21   Prosperi, Joesph Fillers, PA-C    Family History Family History  Problem Relation Age of Onset   Hypertension Maternal Grandfather    Heart disease Mother        heart attack    Social History Social History   Tobacco Use   Smoking status: Every Day    Packs/day: 0.50    Years: 21.00    Total pack years: 10.50  Types: Cigarettes   Smokeless tobacco: Never  Substance Use Topics   Alcohol use: No    Alcohol/week: 42.0 standard drinks of alcohol    Types: 42 Cans of beer per week    Comment: daily six pack-former   Drug use: No     Allergies   Dilaudid [hydromorphone hcl]   Review of Systems Review of Systems  Constitutional:  Negative for chills and fever.  Eyes:  Negative for discharge and redness.  Gastrointestinal:  Negative for abdominal pain, diarrhea, nausea and vomiting.  Musculoskeletal:  Positive for back pain and myalgias. Negative for arthralgias.  Neurological:  Negative for numbness.     Physical Exam Triage Vital Signs ED Triage Vitals  Enc Vitals Group     BP      Pulse      Resp      Temp      Temp src      SpO2      Weight      Height      Head Circumference      Peak Flow      Pain Score      Pain Loc      Pain Edu?      Excl. in [place]?    No data found.  Updated Vital Signs BP 118/65 (BP Location: Left Arm)    Pulse 80   Temp 98 F (36.7 C) (Oral)   Resp 18   LMP 05/25/2013   SpO2 99%      Physical Exam Vitals and nursing note reviewed.  Constitutional:      General: She is not in acute distress.    Appearance: Normal appearance. She is not ill-appearing.  HENT:     Head: Normocephalic and atraumatic.  Eyes:     Conjunctiva/sclera: Conjunctivae normal.  Cardiovascular:     Rate and Rhythm: Normal rate.  Pulmonary:     Effort: Pulmonary effort is normal. No respiratory distress.  Musculoskeletal:     Comments: Full ROM of neck, Right shoulder without pain. No TTP to midline cervical or thoracic spine, Small area of TTP to upper right scapula  Neurological:     Mental Status: She is alert.     Comments: Grip strength 5/5 bilaterally, bicep/ tricep strength 5/5 bilaterally  Psychiatric:        Mood and Affect: Mood normal.        Behavior: Behavior normal.        Thought Content: Thought content normal.      UC Treatments / Results  Labs (all labs ordered are listed, but only abnormal results are displayed) Labs Reviewed - No data to display  EKG   Radiology No results found.  Procedures Procedures (including critical care time)  Medications Ordered in UC Medications - No data to display  Initial Impression / Assessment and Plan / UC Course  I have reviewed the triage vital signs and the nursing notes.  Pertinent labs & imaging results that were available during my care of the patient were reviewed by me and considered in my medical decision making (see chart for details).    Symptoms seemingly musculoskeletal given TTP on exam however given patient's history of pancreatitis discussed referred pain from gallbladder,etc as possible differential. Recommended further evaluation if no improvement with steroid burst or sooner with any worsening. Patient expresses understanding.   Final Clinical Impressions(s) / UC Diagnoses   Final diagnoses:  Acute pain of right  shoulder   Discharge  Instructions   None    ED Prescriptions     Medication Sig Dispense Auth. Provider   predniSONE (DELTASONE) 20 MG tablet Take 2 tablets (40 mg total) by mouth daily with breakfast for 5 days. 10 tablet Francene Finders, PA-C      PDMP not reviewed this encounter.   Francene Finders, PA-C 06/13/22 1425

## 2022-10-08 IMAGING — US US ABDOMEN LIMITED
1 series · 14 of 25 positions shown · non-contrast
Comparison: None.

CLINICAL DATA: Right upper quadrant pain

EXAM:
ULTRASOUND ABDOMEN LIMITED RIGHT UPPER QUADRANT

[Series 1: us abdomen limited · 14 of 43 slices shown]
[im 1/43]
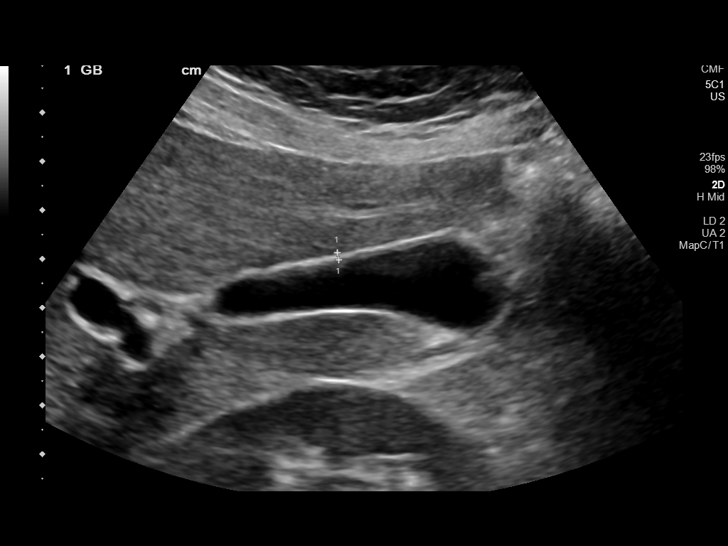
[im 4/43]
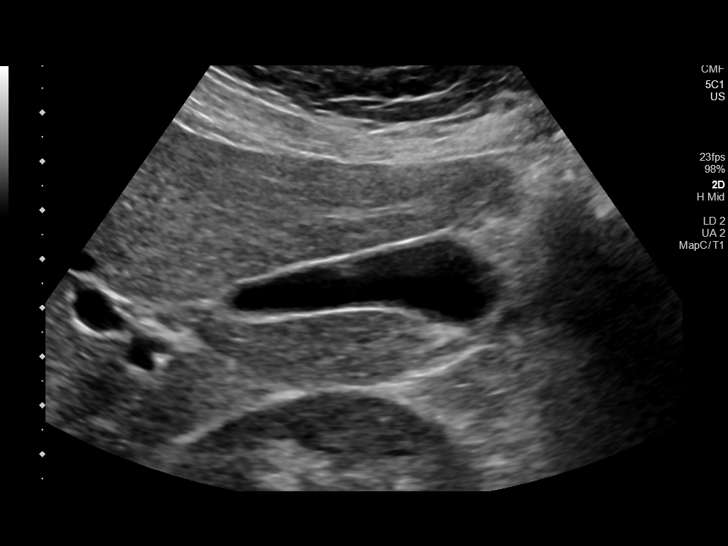
[im 8/43]
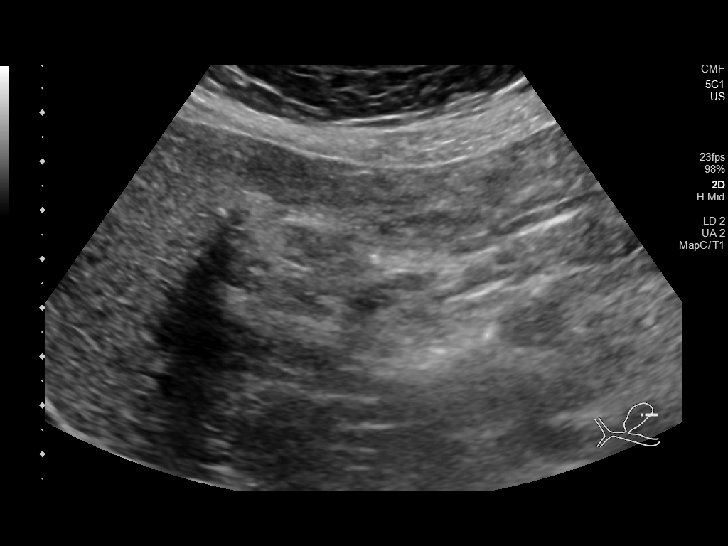
[im 11/43]
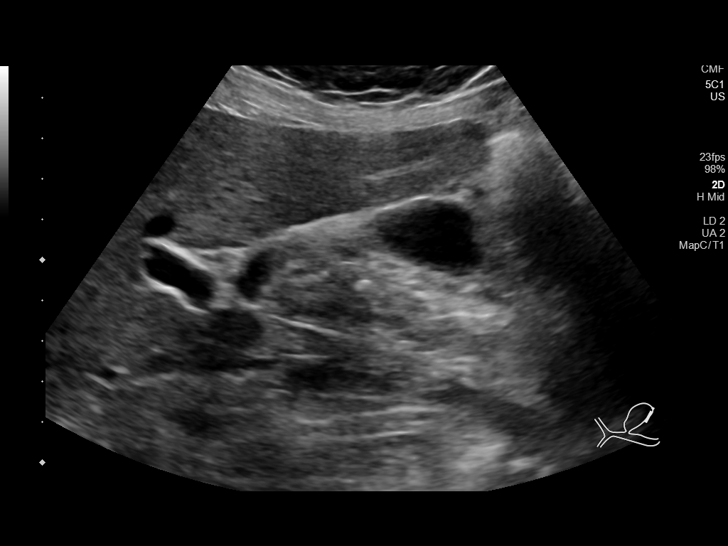
[im 15/43]
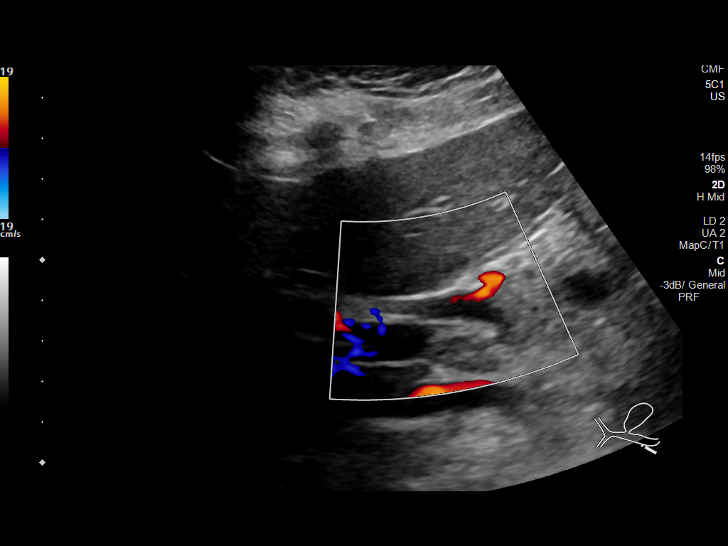
[im 16/43]
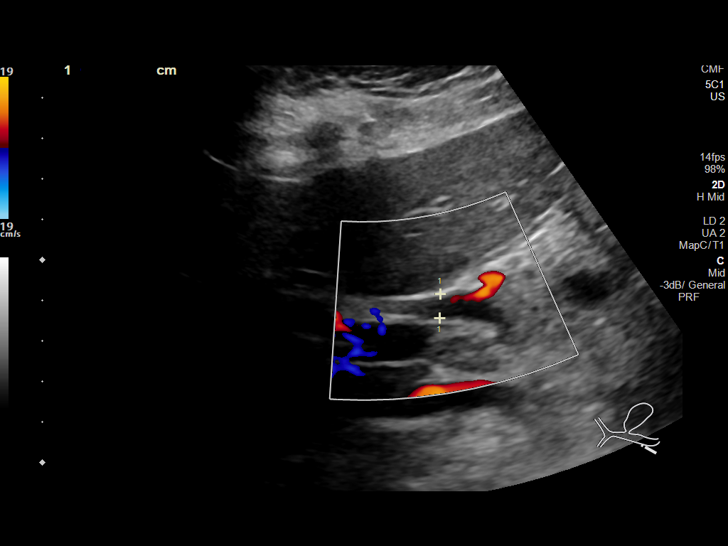
[im 20/43]
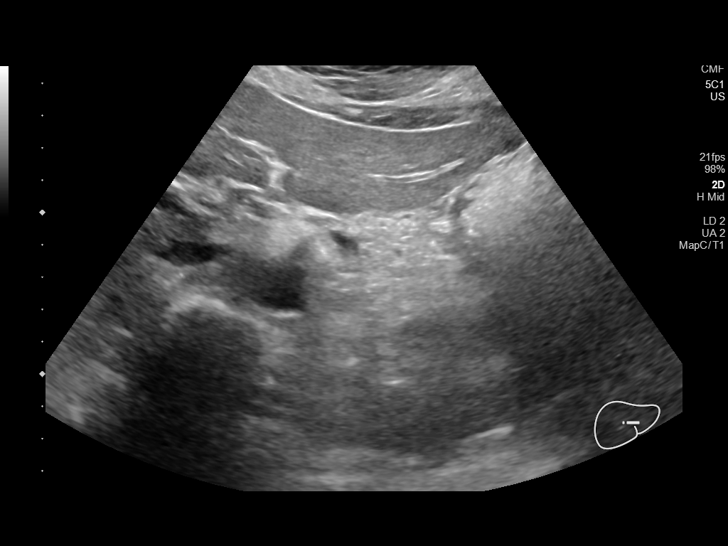
[im 23/43]
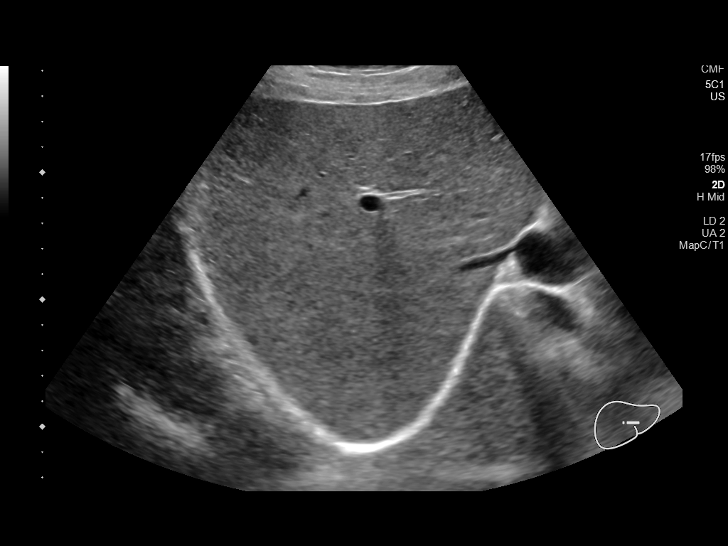
[im 27/43]
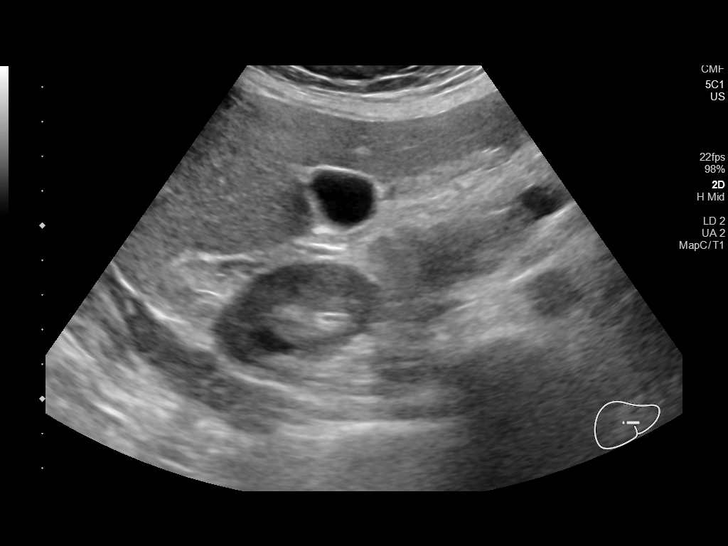
[im 29/43]
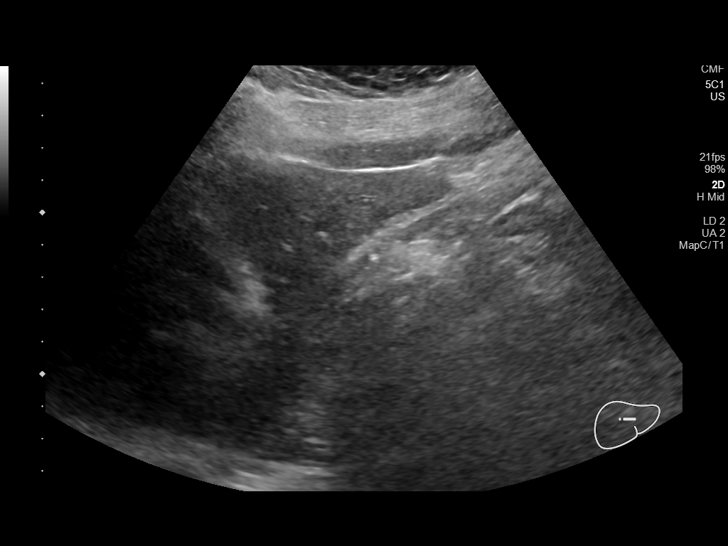
[im 32/43]
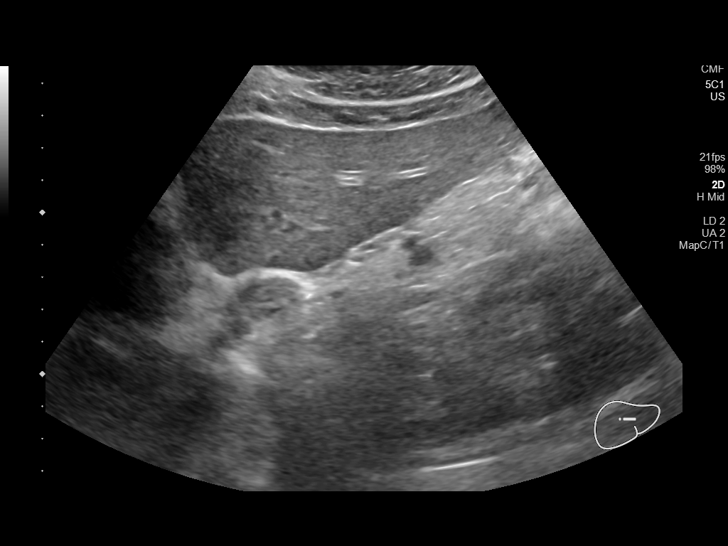
[im 36/43]
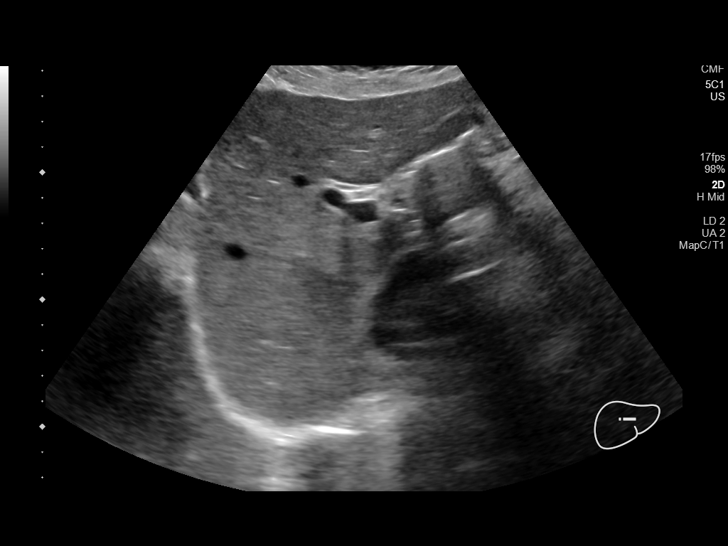
[im 39/43]
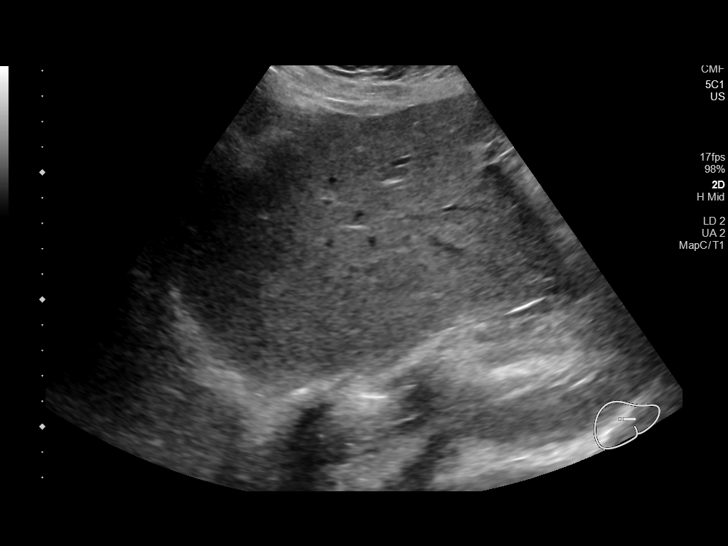
[im 43/43]
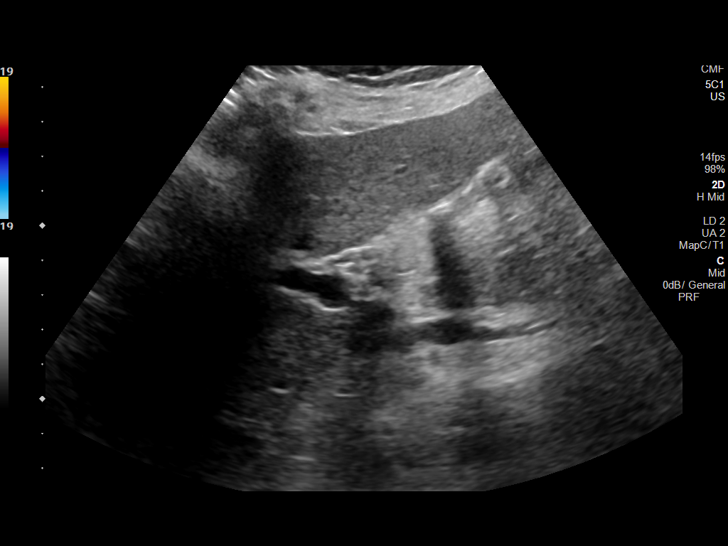

[14 of 25 positions shown; findings below may reference images not displayed]

FINDINGS: Gallbladder:

No gallstones or wall thickening visualized. No sonographic Murphy
sign noted by sonographer.

Common bile duct:

Diameter: 6 mm

Liver:

No focal lesion identified. Within normal limits in parenchymal
echogenicity. Portal vein is patent on color Doppler imaging with
normal direction of blood flow towards the liver.

Other: None.
IMPRESSION: Unremarkable right upper quadrant sonogram.

## 2023-09-30 ENCOUNTER — Ambulatory Visit: Payer: Self-pay

## 2023-09-30 ENCOUNTER — Emergency Department (HOSPITAL_COMMUNITY)
Admission: EM | Admit: 2023-09-30 | Discharge: 2023-09-30 | Disposition: A | Discharge status: Home / Self Care | Payer: Self-pay | Attending: Emergency Medicine | Admitting: Emergency Medicine

## 2023-09-30 ENCOUNTER — Other Ambulatory Visit: Payer: Self-pay

## 2023-09-30 DIAGNOSIS — F17200 Nicotine dependence, unspecified, uncomplicated: Secondary | ICD-10-CM | POA: Insufficient documentation

## 2023-09-30 DIAGNOSIS — R1011 Right upper quadrant pain: Secondary | ICD-10-CM | POA: Insufficient documentation

## 2023-09-30 DIAGNOSIS — T148XXA Other injury of unspecified body region, initial encounter: Secondary | ICD-10-CM

## 2023-09-30 DIAGNOSIS — M549 Dorsalgia, unspecified: Secondary | ICD-10-CM | POA: Insufficient documentation

## 2023-09-30 LAB — CBC WITH DIFFERENTIAL/PLATELET
Abs Immature Granulocytes: 0.01 10*3/uL (ref 0.00–0.07)
Basophils Absolute: 0 10*3/uL (ref 0.0–0.1)
Basophils Relative: 1 %
Eosinophils Absolute: 0.2 10*3/uL (ref 0.0–0.5)
Eosinophils Relative: 3 %
HCT: 39.8 % (ref 36.0–46.0)
Hemoglobin: 12.8 g/dL (ref 12.0–15.0)
Immature Granulocytes: 0 %
Lymphocytes Relative: 52 %
Lymphs Abs: 3.2 10*3/uL (ref 0.7–4.0)
MCH: 30.7 pg (ref 26.0–34.0)
MCHC: 32.2 g/dL (ref 30.0–36.0)
MCV: 95.4 fL (ref 80.0–100.0)
Monocytes Absolute: 0.3 10*3/uL (ref 0.1–1.0)
Monocytes Relative: 5 %
Neutro Abs: 2.3 10*3/uL (ref 1.7–7.7)
Neutrophils Relative %: 39 %
Platelets: 182 10*3/uL (ref 150–400)
RBC: 4.17 MIL/uL (ref 3.87–5.11)
RDW: 12.8 % (ref 11.5–15.5)
WBC: 6 10*3/uL (ref 4.0–10.5)
nRBC: 0 % (ref 0.0–0.2)

## 2023-09-30 LAB — COMPREHENSIVE METABOLIC PANEL WITH GFR
ALT: 15 U/L (ref 0–44)
AST: 19 U/L (ref 15–41)
Albumin: 4.5 g/dL (ref 3.5–5.0)
Alkaline Phosphatase: 51 U/L (ref 38–126)
Anion gap: 9 (ref 5–15)
BUN: 16 mg/dL (ref 6–20)
CO2: 26 mmol/L (ref 22–32)
Calcium: 9.9 mg/dL (ref 8.9–10.3)
Chloride: 101 mmol/L (ref 98–111)
Creatinine, Ser: 0.66 mg/dL (ref 0.44–1.00)
GFR, Estimated: >60 mL/min (ref >60)
Glucose, Bld: 88 mg/dL (ref 70–99)
Potassium: 3.9 mmol/L (ref 3.5–5.1)
Sodium: 136 mmol/L (ref 135–145)
Total Bilirubin: 0.5 mg/dL (ref 0.0–1.2)
Total Protein: 8.4 g/dL — ABNORMAL HIGH (ref 6.5–8.1)

## 2023-09-30 LAB — LIPASE, BLOOD: Lipase: 32 U/L (ref 11–51)

## 2023-09-30 MED ORDER — LIDOCAINE 5 % EX PTCH
1.0000 | MEDICATED_PATCH | CUTANEOUS | Status: DC
  Administered 2023-09-30: 1 via TRANSDERMAL
  Filled 2023-09-30: qty 1

## 2023-09-30 MED ORDER — MORPHINE SULFATE (PF) 4 MG/ML IV SOLN
4.0000 mg | Freq: Once | INTRAVENOUS | Status: DC
Start: 2023-09-30 — End: 2023-09-30

## 2023-09-30 NOTE — ED Triage Notes (Signed)
 Pt arrived reporting RUQ pain, radiating to upper back. States she has hx of pancreatitis and this feels similar. No N/V. Denies any other symptoms. Pain started 2/3 days ago. Also endorses frequent urination with no other urinary symptoms.

## 2023-09-30 NOTE — Telephone Encounter (Signed)
  Chief Complaint: abd pain, hx pancreatitis Symptoms: abd pain, upper right back pain, urine frequency Frequency: comes and goes Pertinent Negatives: Patient denies fever, n/v/d, cp, sob Disposition: [x] ED /[] Urgent Care (no appt availability in office) / [] Appointment(In office/virtual)/ []  [facility]/ [] Home Care/ [] Refused Recommended Disposition /[] [facility]/ []  Follow-up with PCP Additional Notes: instructed to go to ER.   Reason for Disposition  [1] MILD-MODERATE pain AND [2] constant AND [3] present > 2 hours  Answer Assessment - Initial Assessment Questions 1. LOCATION: "Where does it hurt?"     Abd pain goes to Upper right side of back 2. RADIATION: "Does the pain shoot anywhere else?" (e.g., chest, back)     back 3. ONSET: "When did the pain begin?" (e.g., minutes, hours or days ago)      Three days ago 4. SUDDEN: "Gradual or sudden onset?"     suddenly 5. PATTERN "Does the pain come and go, or is it constant?"    - If it comes and goes: "How long does it last?" "Do you have pain now?"     (Note: Comes and goes means the pain is intermittent. It goes away completely between bouts.)    - If constant: "Is it getting better, staying the same, or getting worse?"      (Note: Constant means the pain never goes away completely; most serious pain is constant and gets worse.)      Comes and goes 6. SEVERITY: "How bad is the pain?"  (e.g., Scale 1-10; mild, moderate, or severe)    - MILD (1-3): Doesn't interfere with normal activities, abdomen soft and not tender to touch.     - MODERATE (4-7): Interferes with normal activities or awakens from sleep, abdomen tender to touch.     - SEVERE (8-10): Excruciating pain, doubled over, unable to do any normal activities.       moderate 7. RECURRENT SYMPTOM: "Have you ever had this type of stomach pain before?" If Yes, ask: "When was the last time?" and "What happened that time?"      Hx pancreatitis 8. CAUSE: "What  do you think is causing the stomach pain?"     Pancreatitis  9. RELIEVING/AGGRAVATING FACTORS: "What makes it better or worse?" (e.g., antacids, bending or twisting motion, bowel movement)     nothing 10. OTHER SYMPTOMS: "Do you have any other symptoms?" (e.g., back pain, diarrhea, fever, urination pain, vomiting)       Back pain, urination frequency 11. PREGNANCY: "Is there any chance you are pregnant?" "When was your last menstrual period?"       na  Protocols used: Abdominal Pain - [facility]

## 2023-09-30 NOTE — ED Provider Triage Note (Signed)
 Emergency Medicine Provider Triage Evaluation Note  Miranda Jackson , a 53 y.o. female  was evaluated in triage.  Pt complains of abdominal pain. History of pancreatitis in the past and this feels similar. RUQ pain for the past 3 days that radiates to the back. One episode of nausea today.   Review of Systems  Positive: Abdominal pain Negative: Fevers, diarrhea  Physical Exam  BP 138/81 (BP Location: Left Arm)   Pulse 60   Temp 98 F (36.7 C) (Oral)   Resp 15   LMP 05/25/2013   SpO2 100%  Gen:   Awake, no distress   Resp:  Normal effort  MSK:   Moves extremities without difficulty  Other:  RUQ tenderness  Medical Decision Making  Medically screening exam initiated at 12:03 PM.  Appropriate orders placed.  Miranda Jackson was informed that the remainder of the evaluation will be completed by another provider, this initial triage assessment does not replace that evaluation, and the importance of remaining in the ED until their evaluation is complete.   Sonnie Dusky, PA-C 09/30/23 1208

## 2023-09-30 NOTE — ED Provider Notes (Signed)
 [place] EMERGENCY DEPARTMENT AT [facility] Provider Note   CSN: [id number] Arrival date & time: 09/30/23  1106     History  Chief Complaint  Patient presents with   Back Pain   Abdominal Pain    Miranda Jackson is a 53 y.o. female with a history of tobacco dependence and anemia who presents the ED today for abdominal pain.  Patient reports right upper quadrant abdominal pain that radiates to the back, underneath her shoulder blade.  Reports that she had similar pain in the past that was due to pancreatitis and was advised to come in by primary care for further evaluation.  That she was sitting on the couch when the pain first occurred.  Denies alcohol use.  No associated fevers, nausea, vomiting, changes to urinary or bowel habits. Triage note mention patient frequent urination, patient reports he has been drinking increased amounts of water with lemon and thinks has been urinating more because of that.  She is not concerned about UTI at this time.    Home Medications Prior to Admission medications   Medication Sig Start Date End Date Taking? Authorizing Provider  hydrOXYzine  (ATARAX /VISTARIL ) 25 MG tablet Take 1 tablet (25 mg total) by mouth every 6 (six) hours. 11/24/18   Wilhelmenia Harada, Amy V, PA-C  ibuprofen  (ADVIL ,MOTRIN ) 800 MG tablet Take 1 tablet (800 mg total) by mouth 3 (three) times daily. Patient not taking: Reported on 08/03/2018 07/25/18   Sherel Dikes, PA-C  ondansetron  (ZOFRAN ) 4 MG tablet Take 1 tablet (4 mg total) by mouth every 6 (six) hours. 07/18/21   Prosperi, Christian H, PA-C  oxyCODONE -acetaminophen  (PERCOCET/ROXICET) 5-325 MG tablet Take 1 tablet by mouth every 6 (six) hours as needed for severe pain. 07/18/21   Prosperi, Christian H, PA-C      Allergies    Dilaudid  [hydromorphone  hcl]    Review of Systems   Review of Systems  Gastrointestinal:  Positive for abdominal pain.  Musculoskeletal:  Positive for back pain.  All other systems reviewed and are  negative.   Physical Exam Updated Vital Signs BP 138/81 (BP Location: Left Arm)   Pulse 60   Temp 98 F (36.7 C) (Oral)   Resp 15   LMP 05/25/2013   SpO2 100%  Physical Exam Vitals and nursing note reviewed.  Constitutional:      General: She is not in acute distress.    Appearance: Normal appearance.  HENT:     Head: Normocephalic and atraumatic.     Mouth/Throat:     Mouth: Mucous membranes are moist.  Eyes:     Conjunctiva/sclera: Conjunctivae normal.     Pupils: Pupils are equal, round, and reactive to light.  Cardiovascular:     Rate and Rhythm: Normal rate and regular rhythm.     Pulses: Normal pulses.     Heart sounds: Normal heart sounds.  Pulmonary:     Effort: Pulmonary effort is normal.     Breath sounds: Normal breath sounds.  Abdominal:     Palpations: Abdomen is soft.     Tenderness: There is abdominal tenderness. There is no right CVA tenderness or left CVA tenderness.     Comments: RUQ tenderness   Musculoskeletal:        General: Tenderness present. Normal range of motion.     Cervical back: Normal range of motion.     Comments: Tenderness beneath right scapula.  No midline tenderness to palpation of the spine.  Range of motion, strength, and sensation of  upper and extremities intact bilaterally.  Skin:    General: Skin is warm and dry.     Findings: No rash.  Neurological:     General: No focal deficit present.     Mental Status: She is alert.  Psychiatric:        Mood and Affect: Mood normal.        Behavior: Behavior normal.    ED Results / Procedures / Treatments   Labs (all labs ordered are listed, but only abnormal results are displayed) Labs Reviewed  COMPREHENSIVE METABOLIC PANEL WITH GFR - Abnormal; Notable for the following components:      Result Value   Total Protein 8.4 (*)    All other components within normal limits  LIPASE, BLOOD  CBC WITH DIFFERENTIAL/PLATELET    EKG None  Radiology No results  found.  Procedures Procedures    Medications Ordered in ED Medications  lidocaine  (LIDODERM ) 5 % 1 patch (1 patch Transdermal Patch Applied 09/30/23 1431)    ED Course/ Medical Decision Making/ A&P                                 Medical Decision Making Amount and/or Complexity of Data Reviewed Labs: ordered.  Risk Prescription drug management.   This patient presents to the ED for concern of abdominal and back pain, this involves an extensive number of treatment options, and is a complaint that carries with it a high risk of complications and morbidity.   Differential diagnosis includes: Cholecystitis, cholangitis, choledocholithiasis, pancreatitis, constipation, IBS, IBD, gastroenteritis, muscle strain/spasm, etc.   Comorbidities  See HPI above   Additional History  Additional history obtained from prior records   Lab Tests  I ordered and personally interpreted labs.  The pertinent results include:   CMP, lipase, and CBC are reassuring - no AKI, derangement, pancreatitis, transaminitis, infection, or anemia   Problem List / ED Course / Critical Interventions / Medication Management  She reports right upper quadrant pain that radiates to her back for the past 3 days.   Has a history of pancreatitis and states that this feels similar.  Advised to come in by her PCP for further evaluation. Stopped drinking alcohol 9 years ago. Pain not worse after eating. No fevers, nausea, vomiting, changes to urinary or bowel habits. I ordered medications including: Lidocaine  patch for pain - given prior to discharge  I have reviewed the patients home medicines and have made adjustments as needed   Social Determinants of Health  Tobacco use   Test / Admission - Considered  Discussed findings with patient.  All questions answered. She is stable and safe discharge home. Return precautions given.       Final Clinical Impression(s) / ED Diagnoses Final diagnoses:   Muscle strain    Rx / DC Orders ED Discharge Orders     None         Sonnie Dusky, PA-C 09/30/23 1445    Roberts Ching, MD 10/01/23 1112

## 2023-09-30 NOTE — Discharge Instructions (Signed)
 As discussed, your labs are unremarkable.  Your gallbladder and pancreas levels are reassuring.  Your pain is most likely due to muscle strain.  Alternate between ibuprofen  600 mg and Tylenol  650 mg every 4 hours as needed for pain.  You can also get lidocaine  patches over-the-counter to help with additional pain relief.  Follow-up with your primary care fighter in the next week for reevaluation of your symptoms.

## 2023-11-18 ENCOUNTER — Ambulatory Visit: Payer: Self-pay | Admitting: Family

## 2024-01-08 ENCOUNTER — Ambulatory Visit: Payer: Self-pay | Admitting: Family

## 2024-05-18 ENCOUNTER — Ambulatory Visit: Admitting: Podiatry

## 2024-05-25 ENCOUNTER — Ambulatory Visit: Admitting: Podiatry

## 2024-05-25 DIAGNOSIS — M216X1 Other acquired deformities of right foot: Secondary | ICD-10-CM | POA: Diagnosis not present

## 2024-05-25 DIAGNOSIS — M216X2 Other acquired deformities of left foot: Secondary | ICD-10-CM | POA: Diagnosis not present

## 2024-05-25 DIAGNOSIS — M7752 Other enthesopathy of left foot: Secondary | ICD-10-CM

## 2024-05-25 NOTE — Progress Notes (Signed)
 "  Subjective:  Patient ID: Miranda Jackson, female    DOB: 1970-09-29,  MRN: [id number]  Chief Complaint  Patient presents with   Foot Pain    Left foot pain     54 y.o. female presents with the above complaint.  Patient presents with left bunion deformity.  Is causing her a lot of pain hurts with ambulation and shoe pressure has not seen her last prior to seeing me.  She would like to discuss treatment options for it.  She states has been dealing with this for quite some time the bunion has been causing a lot of discomfort and is inflamed.  She has flatfoot deformity she does not wear any orthotic she would like to discuss orthotic options as well.   Review of Systems: Negative except as noted in the HPI. Denies N/V/F/Ch.  Past Medical History:  Diagnosis Date   Alcohol abuse    Anemia    Fibroids    SVD (spontaneous vaginal delivery)    x 3   Tobacco dependence    Current Medications[1]  Tobacco Use History[2]  Allergies[3] Objective:  There were no vitals filed for this visit. There is no height or weight on file to calculate BMI. Constitutional Well developed. Well nourished.  Vascular Dorsalis pedis pulses palpable bilaterally. Posterior tibial pulses palpable bilaterally. Capillary refill normal to all digits.  No cyanosis or clubbing noted. Pedal hair growth normal.  Neurologic Normal speech. Oriented to person, place, and time. Epicritic sensation to light touch grossly present bilaterally.  Dermatologic Nails well groomed and normal in appearance. No open wounds. No skin lesions.  Orthopedic: Pain on palpation to the left first metatarsal phalangeal joint pain with range of motion of the joint.  Moderate bunion deformity noted.  Mild inflammation noted.  Pes planovalgus foot structure noted with calcaneovalgus to many toe signs on [place] the arch with dorsiflexion of the hallux unable to perform single heel raise   Radiographs: None Assessment:   1. Capsulitis  of metatarsophalangeal (MTP) joint of left foot   2. Other acquired deformities of right foot   3. Other acquired deformities of left foot    Plan:  Patient was evaluated and treated and all questions answered.  Left first MTP capsulitis - All questions and concerns were discussed with the patient in extensive detail given the amount of pain that she is having should benefit from steroid injection because of inflammatory, surgical pain.  Patient agrees with plan to proceed with steroid injection -A steroid injection was performed at left first MTP capsulitis using 1% plain Lidocaine  and 10 mg of Kenalog. This was well tolerated.  Pes planovalgus/foot deformity -I explained to patient the etiology of pes planovalgus and relationship with heel pain/arch pain and various treatment options were discussed.  Given patient foot structure in the setting of heel pain/arch pain I believe patient will benefit from custom-made orthotics to help control the hindfoot motion support the arch of the foot and take the stress away from arches.  Patient agrees with the plan like to proceed with orthotics -Patient was casted for orthotics    No follow-ups on file.     [1]  Current Outpatient Medications:    hydrOXYzine  (ATARAX /VISTARIL ) 25 MG tablet, Take 1 tablet (25 mg total) by mouth every 6 (six) hours., Disp: 12 tablet, Rfl: 0   ibuprofen  (ADVIL ,MOTRIN ) 800 MG tablet, Take 1 tablet (800 mg total) by mouth 3 (three) times daily. (Patient not taking: Reported on 08/03/2018), Disp: 21  tablet, Rfl: 0   ondansetron  (ZOFRAN ) 4 MG tablet, Take 1 tablet (4 mg total) by mouth every 6 (six) hours., Disp: 12 tablet, Rfl: 0   oxyCODONE -acetaminophen  (PERCOCET/ROXICET) 5-325 MG tablet, Take 1 tablet by mouth every 6 (six) hours as needed for severe pain., Disp: 15 tablet, Rfl: 0 [2]  Social History Tobacco Use  Smoking Status Every Day   Current packs/day: 0.50   Average packs/day: 0.5 packs/day for 21.0 years  (10.5 ttl pk-yrs)   Types: Cigarettes  Smokeless Tobacco Never  [3]  Allergies Allergen Reactions   Dilaudid  [Hydromorphone  Hcl] Itching   "

## 2024-06-14 ENCOUNTER — Telehealth: Payer: Self-pay

## 2024-06-14 NOTE — Telephone Encounter (Signed)
 Orthotics are in the [place] office. Spoke to [employer] and scheduled her for 06/15/2024 to [facility]

## 2024-06-15 ENCOUNTER — Ambulatory Visit

## 2024-06-15 DIAGNOSIS — M216X1 Other acquired deformities of right foot: Secondary | ICD-10-CM

## 2024-06-15 DIAGNOSIS — M216X2 Other acquired deformities of left foot: Secondary | ICD-10-CM

## 2024-06-15 DIAGNOSIS — M7752 Other enthesopathy of left foot: Secondary | ICD-10-CM

## 2024-06-15 NOTE — Progress Notes (Signed)
